# Patient Record
Sex: Male | Born: 1975 | Race: White | Hispanic: No | Marital: Single | Smoking: Current every day smoker
Health system: Southern US, Community
[De-identification: ages and names within clinical notes are randomized; demographics above are authoritative.]

## PROBLEM LIST (undated history)

## (undated) DIAGNOSIS — R569 Unspecified convulsions: Secondary | ICD-10-CM

## (undated) DIAGNOSIS — E119 Type 2 diabetes mellitus without complications: Secondary | ICD-10-CM

## (undated) DIAGNOSIS — F319 Bipolar disorder, unspecified: Secondary | ICD-10-CM

## (undated) DIAGNOSIS — I517 Cardiomegaly: Secondary | ICD-10-CM

## (undated) HISTORY — PX: HERNIA REPAIR: SHX51

---

## 2005-08-23 ENCOUNTER — Emergency Department: Payer: Self-pay | Admitting: Emergency Medicine

## 2006-02-08 ENCOUNTER — Emergency Department: Payer: Self-pay | Admitting: Emergency Medicine

## 2006-02-16 ENCOUNTER — Emergency Department: Payer: Self-pay | Admitting: Emergency Medicine

## 2006-07-20 ENCOUNTER — Emergency Department: Payer: Self-pay | Admitting: Emergency Medicine

## 2007-03-09 ENCOUNTER — Emergency Department: Payer: Self-pay | Admitting: General Practice

## 2007-05-25 ENCOUNTER — Emergency Department: Payer: Self-pay | Admitting: Emergency Medicine

## 2007-05-29 ENCOUNTER — Emergency Department: Payer: Self-pay | Admitting: Unknown Physician Specialty

## 2008-02-08 ENCOUNTER — Emergency Department: Payer: Self-pay | Admitting: Internal Medicine

## 2010-03-30 ENCOUNTER — Emergency Department: Payer: Self-pay | Admitting: Internal Medicine

## 2010-09-16 ENCOUNTER — Emergency Department: Payer: Self-pay | Admitting: Emergency Medicine

## 2011-01-01 ENCOUNTER — Emergency Department: Payer: Self-pay | Admitting: Emergency Medicine

## 2011-01-06 ENCOUNTER — Inpatient Hospital Stay: Payer: Self-pay | Admitting: Internal Medicine

## 2011-01-18 ENCOUNTER — Inpatient Hospital Stay: Payer: Self-pay | Admitting: Internal Medicine

## 2011-02-04 ENCOUNTER — Ambulatory Visit: Payer: Self-pay | Admitting: Internal Medicine

## 2011-04-10 ENCOUNTER — Emergency Department: Payer: Self-pay | Admitting: Emergency Medicine

## 2011-07-29 ENCOUNTER — Emergency Department: Payer: Self-pay | Admitting: Emergency Medicine

## 2013-04-12 LAB — ETHANOL
Ethanol %: <0.003 % (ref 0.000–0.080)
Ethanol: <3 mg/dL

## 2013-04-12 LAB — DRUG SCREEN, URINE
Amphetamines, Ur Screen: POSITIVE (ref ?–1000)
Benzodiazepine, Ur Scrn: NEGATIVE (ref ?–200)
MDMA (Ecstasy)Ur Screen: NEGATIVE (ref ?–500)
Methadone, Ur Screen: NEGATIVE (ref ?–300)
Opiate, Ur Screen: NEGATIVE (ref ?–300)
Phencyclidine (PCP) Ur S: NEGATIVE (ref ?–25)
Tricyclic, Ur Screen: NEGATIVE (ref ?–1000)

## 2013-04-12 LAB — COMPREHENSIVE METABOLIC PANEL
Albumin: 3.6 g/dL (ref 3.4–5.0)
Alkaline Phosphatase: 55 U/L (ref 50–136)
Anion Gap: 7 (ref 7–16)
BUN: 22 mg/dL — ABNORMAL HIGH (ref 7–18)
Calcium, Total: 9.6 mg/dL (ref 8.5–10.1)
Creatinine: 0.98 mg/dL (ref 0.60–1.30)
EGFR (African American): >60
Glucose: 149 mg/dL — ABNORMAL HIGH (ref 65–99)
Osmolality: 287 (ref 275–301)
Total Protein: 6.9 g/dL (ref 6.4–8.2)

## 2013-04-12 LAB — CBC
HCT: 45 % (ref 40.0–52.0)
HGB: 15.4 g/dL (ref 13.0–18.0)
MCH: 30.6 pg (ref 26.0–34.0)
MCHC: 34.3 g/dL (ref 32.0–36.0)
Platelet: 196 10*3/uL (ref 150–440)
RBC: 5.05 10*6/uL (ref 4.40–5.90)
WBC: 12 10*3/uL — ABNORMAL HIGH (ref 3.8–10.6)

## 2013-04-12 LAB — SALICYLATE LEVEL: Salicylates, Serum: 2 mg/dL

## 2013-04-13 ENCOUNTER — Inpatient Hospital Stay: Payer: Self-pay | Admitting: Psychiatry

## 2013-04-13 LAB — URINALYSIS, COMPLETE
Bacteria: NONE SEEN
Bilirubin,UR: NEGATIVE
Blood: NEGATIVE
Glucose,UR: >=500 mg/dL (ref 0–75)
Ketone: NEGATIVE
Protein: NEGATIVE
Specific Gravity: 1.016 (ref 1.003–1.030)
Squamous Epithelial: NONE SEEN

## 2013-04-14 LAB — BEHAVIORAL MEDICINE 1 PANEL
Albumin: 3.2 g/dL — ABNORMAL LOW (ref 3.4–5.0)
Alkaline Phosphatase: 56 U/L (ref 50–136)
Anion Gap: 7 (ref 7–16)
Basophil #: 0 10*3/uL (ref 0.0–0.1)
Calcium, Total: 9 mg/dL (ref 8.5–10.1)
Co2: 26 mmol/L (ref 21–32)
EGFR (Non-African Amer.): >60
Eosinophil %: 0.7 %
Glucose: 192 mg/dL — ABNORMAL HIGH (ref 65–99)
HCT: 41.2 % (ref 40.0–52.0)
MCH: 30.6 pg (ref 26.0–34.0)
MCV: 88 fL (ref 80–100)
Monocyte #: 0.8 x10 3/mm (ref 0.2–1.0)
Monocyte %: 8.2 %
Potassium: 3.5 mmol/L (ref 3.5–5.1)
RBC: 4.68 10*6/uL (ref 4.40–5.90)
SGOT(AST): 10 U/L — ABNORMAL LOW (ref 15–37)
Sodium: 141 mmol/L (ref 136–145)
Thyroid Stimulating Horm: 2.49 u[IU]/mL
Total Protein: 6.5 g/dL (ref 6.4–8.2)
WBC: 9.7 10*3/uL (ref 3.8–10.6)

## 2014-03-16 ENCOUNTER — Emergency Department: Payer: Self-pay | Admitting: Emergency Medicine

## 2014-04-16 ENCOUNTER — Emergency Department: Payer: Self-pay | Admitting: Internal Medicine
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
## 2015-01-12 NOTE — Discharge Summary (Signed)
PATIENT NAME:  Jacob Holmes, Jacob Holmes MR#:  [id number] DATE OF BIRTH:  10-Jun-1976  DATE OF ADMISSION:  04/13/2013 DATE OF DISCHARGE:    CHIEF COMPLAINT: The patient reported that he was feeling very sad and was thinking about ending his life.  HISTORY OF PRESENT ILLNESS: The patient is a 39 year old, Caucasian male, who came to the Emergency Room after he had a conflict or a situation with his ex-girlfriend. The patient reported a complicated relationship with his ex-girlfriend's daughter. This fall out had caused him to become very depressed and feel helpless and hopeless. He was thinking of ending his life and at that point, came to the ER. He sees Dr. Lacie ScottsNiemeyer for medication management and takes Depakote for seizure disorder. The patient denied any illegal use of drugs or alcohol.   HOSPITAL COURSE: The patient was admitted on usual precautions. He was restarted on his Depakote 1000 mg at night for epilepsy and Zoloft at 25 mg to help with mood symptoms. He was also started on hydroxyzine 25 mg to be taken as needed, once every 4 hours for his anxiety. The patient was cooperative on the unit and participated in all unit activities. He was able to make use of groups and develop coping skills to deal with a personal situation. Throughout the course of his hospital stay, the patient gained better perspective into his situation and realized that he needed to let things go and focus on his own life. By the end of this hospitalization, the patient was very much stabilized and did not report any suicidal or homicidal thoughts. He was mentally stable and ready for discharge.   PAST MEDICAL HISTORY: History of seizures and back pain.  FAMILY HISTORY: Mental illness: Reports mother has depression and a sister with bipolar disorder.  SOCIAL HISTORY:  The patient lives by himself.   SUBSTANCE USE HISTORY: The patient denied any use, but his initial drug screen was positive for amphetamines.   ALLERGIES:  MORPHINE.  MENTAL STATUS EXAM AT DISCHARGE: The patient was casually groomed. He had good eye contact and speech was soft and slow, which is normal to him. He was alert and oriented to all spheres. His affect was smiling and mood was much improved. He denied any suicidal or homicidal ideation. He denied any auditory or visual hallucinations. He has fair insight and judgment into his condition.   Suicide Risk Assessment: Patient is at minimal risk for suicide.  DIAGNOSES: AXIS I:  Major depressive disorder. AXIS II: Deferred. AXIS III: Epilepsy and chronic back pain. AXIS IV: Relationship issues and financial stressors. AXIS V: GAF of 75.  DISPOSITION/PLAN: Continue Depakote 1000 mg at bedtime for his epilepsy. Continue Zoloft 25 mg for depression. The patient is to follow up with an outside psychiatrist for ongoing treatment. recommended to see a therapist on a weekly basis to deal with ex girlfriend situation. The patient is aware that he is to make sure that he keeps his followup appointments.  ____________________________ Patrick NorthHimabindu Shawnette Augello, MD hr:aw D: 04/18/2013 11:10:30 ET T: 04/18/2013 11:23:22 ET JOB#: [id number]  cc: Patrick NorthHimabindu Lavanya Roa, MD, <Dictator> Patrick NorthHIMABINDU Joachim Carton MD ELECTRONICALLY SIGNED 04/20/2013 12:12

## 2015-05-07 ENCOUNTER — Emergency Department
Admission: EM | Admit: 2015-05-07 | Discharge: 2015-05-08 | Disposition: A | Discharge status: Other Acute Inpatient Hospital | Payer: Medicaid Other | Attending: Emergency Medicine | Admitting: Emergency Medicine

## 2015-05-07 ENCOUNTER — Encounter: Payer: Self-pay | Admitting: Urgent Care

## 2015-05-07 DIAGNOSIS — F329 Major depressive disorder, single episode, unspecified: Secondary | ICD-10-CM

## 2015-05-07 DIAGNOSIS — F1510 Other stimulant abuse, uncomplicated: Secondary | ICD-10-CM | POA: Insufficient documentation

## 2015-05-07 DIAGNOSIS — F1490 Cocaine use, unspecified, uncomplicated: Secondary | ICD-10-CM | POA: Diagnosis not present

## 2015-05-07 DIAGNOSIS — F1310 Sedative, hypnotic or anxiolytic abuse, uncomplicated: Secondary | ICD-10-CM | POA: Diagnosis not present

## 2015-05-07 DIAGNOSIS — F3130 Bipolar disorder, current episode depressed, mild or moderate severity, unspecified: Secondary | ICD-10-CM | POA: Diagnosis not present

## 2015-05-07 DIAGNOSIS — F1210 Cannabis abuse, uncomplicated: Secondary | ICD-10-CM | POA: Diagnosis not present

## 2015-05-07 DIAGNOSIS — Z720 Tobacco use: Secondary | ICD-10-CM | POA: Diagnosis not present

## 2015-05-07 DIAGNOSIS — Z008 Encounter for other general examination: Secondary | ICD-10-CM | POA: Diagnosis present

## 2015-05-07 DIAGNOSIS — F32A Depression, unspecified: Secondary | ICD-10-CM

## 2015-05-07 HISTORY — DX: Cardiomegaly: I51.7

## 2015-05-07 HISTORY — DX: Unspecified convulsions: R56.9

## 2015-05-07 HISTORY — DX: Bipolar disorder, unspecified: F31.9

## 2015-05-07 LAB — CBC
HCT: 47.5 % (ref 40.0–52.0)
HEMOGLOBIN: 15.8 g/dL (ref 13.0–18.0)
MCH: 30.1 pg (ref 26.0–34.0)
MCHC: 33.2 g/dL (ref 32.0–36.0)
MCV: 90.5 fL (ref 80.0–100.0)
Platelets: 184 10*3/uL (ref 150–440)
RBC: 5.25 MIL/uL (ref 4.40–5.90)
RDW: 12.8 % (ref 11.5–14.5)
WBC: 10.2 10*3/uL (ref 3.8–10.6)

## 2015-05-07 LAB — COMPREHENSIVE METABOLIC PANEL
ALBUMIN: 4.3 g/dL (ref 3.5–5.0)
ALT: 22 U/L (ref 17–63)
AST: 24 U/L (ref 15–41)
Alkaline Phosphatase: 45 U/L (ref 38–126)
Anion gap: 11 (ref 5–15)
BUN: 14 mg/dL (ref 6–20)
CHLORIDE: 104 mmol/L (ref 101–111)
CO2: 27 mmol/L (ref 22–32)
CREATININE: 0.95 mg/dL (ref 0.61–1.24)
Calcium: 9.3 mg/dL (ref 8.9–10.3)
GFR calc non Af Amer: >60 mL/min (ref >60)
GLUCOSE: 213 mg/dL — AB (ref 65–99)
Potassium: 3.6 mmol/L (ref 3.5–5.1)
SODIUM: 142 mmol/L (ref 135–145)
Total Bilirubin: 0.6 mg/dL (ref 0.3–1.2)
Total Protein: 7.2 g/dL (ref 6.5–8.1)

## 2015-05-07 LAB — ACETAMINOPHEN LEVEL: Acetaminophen (Tylenol), Serum: <10 ug/mL — ABNORMAL LOW (ref 10–30)

## 2015-05-07 LAB — SALICYLATE LEVEL

## 2015-05-07 LAB — URINE DRUG SCREEN, QUALITATIVE (ARMC ONLY)
Amphetamines, Ur Screen: POSITIVE — AB
Barbiturates, Ur Screen: NOT DETECTED
Benzodiazepine, Ur Scrn: POSITIVE — AB
CANNABINOID 50 NG, UR ~~LOC~~: POSITIVE — AB
COCAINE METABOLITE, UR ~~LOC~~: POSITIVE — AB
MDMA (ECSTASY) UR SCREEN: NOT DETECTED
Methadone Scn, Ur: NOT DETECTED
Opiate, Ur Screen: POSITIVE — AB
Phencyclidine (PCP) Ur S: NOT DETECTED
TRICYCLIC, UR SCREEN: NOT DETECTED

## 2015-05-07 LAB — ETHANOL: Alcohol, Ethyl (B): 7 mg/dL — ABNORMAL HIGH (ref <5)

## 2015-05-07 MED ORDER — NICOTINE 10 MG IN INHA
1.0000 | RESPIRATORY_TRACT | Status: DC | PRN
  Administered 2015-05-08: 1 via RESPIRATORY_TRACT
  Filled 2015-05-07: qty 36

## 2015-05-07 NOTE — ED Notes (Signed)
Patient was dressed out into purple paper scrubs and left with the triage nurse.

## 2015-05-07 NOTE — ED Notes (Signed)
Patient presents for [facility] evaluation. Expressing SI without a specific plan; states, "I want to go to sleep and call it over. I think the world would be better without me here". Patient expressing recent loss of his father. Tearfual and anxious in triage. Patient off of Lamictal and Depakote.

## 2015-05-07 NOTE — ED Provider Notes (Signed)
[facility] Emergency Department Provider Note  ____________________________________________  Time seen: Approximately 11:09 PM  I have reviewed the triage vital signs and the nursing notes.   HISTORY  Chief Complaint Psychiatric Evaluation    HPI Jacob Holmes is a 39 y.o. male who presents to the ED from home for behavioral health evaluation. Expressing depression with vague SI. "I want to go to sleep and call it over. I think the world would be better without me here." Upset at over recent loss of father. Supposed to be on Lamictal and Depakote but states he has not been taking his medicines.Voices no medical complaints.   Past Medical History  Diagnosis Date  . Bipolar disorder   . Seizures   . Cardiomegaly     There are no active problems to display for this patient.   Past Surgical History  Procedure Laterality Date  . Hernia repair      No current outpatient prescriptions on file.  Allergies Review of patient's allergies indicates no known allergies.  No family history on file.  Social History Social History  Substance Use Topics  . Smoking status: Current Every Day Smoker  . Smokeless tobacco: None  . Alcohol Use: No    Review of Systems Constitutional: No fever/chills Eyes: No visual changes. ENT: No sore throat. Cardiovascular: Denies chest pain. Respiratory: Denies shortness of breath. Gastrointestinal: No abdominal pain.  No nausea, no vomiting.  No diarrhea.  No constipation. Genitourinary: Negative for dysuria. Musculoskeletal: Negative for back pain. Skin: Negative for rash. Neurological: Negative for headaches, focal weakness or numbness. Psychiatric:Positive for depression.  10-point ROS otherwise negative.  ____________________________________________   PHYSICAL EXAM:  VITAL SIGNS: ED Triage Vitals  Enc Vitals Group     BP 05/07/15 2138 141/108 mmHg     Pulse Rate 05/07/15 2138 105     Resp  05/07/15 2138 22     Temp 05/07/15 2138 98.6 F (37 C)     Temp Source 05/07/15 2138 Oral     SpO2 05/07/15 2138 99 %     Weight 05/07/15 2138 170 lb (77.111 kg)     Height 05/07/15 2138 5\' 6"  (1.676 m)     Head Cir --      Peak Flow --      Pain Score 05/07/15 2139 0     Pain Loc --      Pain Edu? --      Excl. in GC? --     Constitutional: Alert and oriented. Well appearing and in no acute distress. Eyes: Conjunctivae are normal. PERRL. EOMI. Head: Atraumatic. Nose: No congestion/rhinnorhea. Mouth/Throat: Mucous membranes are moist.  Oropharynx non-erythematous. Neck: No stridor.   Cardiovascular: Normal rate, regular rhythm. Grossly normal heart sounds.  Good peripheral circulation. Respiratory: Normal respiratory effort.  No retractions. Lungs CTAB. Gastrointestinal: Soft and nontender. No distention. No abdominal bruits. No CVA tenderness. Musculoskeletal: No lower extremity tenderness nor edema.  No joint effusions. Neurologic:  Normal speech and language. No gross focal neurologic deficits are appreciated. No gait instability. Skin:  Skin is warm, dry and intact. No rash noted. Psychiatric: Mood and affect are flat. Speech and behavior are normal.  ____________________________________________   LABS (all labs ordered are listed, but only abnormal results are displayed)  Labs Reviewed  COMPREHENSIVE METABOLIC PANEL - Abnormal; Notable for the following:    Glucose, Bld 213 (*)    All other components within normal limits  ETHANOL - Abnormal; Notable for the following:  Alcohol, Ethyl (B) 7 (*)    All other components within normal limits  ACETAMINOPHEN LEVEL - Abnormal; Notable for the following:    Acetaminophen (Tylenol), Serum <10 (*)    All other components within normal limits  URINE DRUG SCREEN, QUALITATIVE ([facility] ONLY) - Abnormal; Notable for the following:    Amphetamines, Ur Screen POSITIVE (*)    Cocaine Metabolite,Ur [place] POSITIVE (*)    Opiate, Ur  Screen POSITIVE (*)    Cannabinoid 50 Ng, Ur [place] POSITIVE (*)    Benzodiazepine, Ur Scrn POSITIVE (*)    All other components within normal limits  SALICYLATE LEVEL  CBC   ____________________________________________  EKG  None  ____________________________________________  RADIOLOGY  None ____________________________________________   PROCEDURES  Procedure(s) performed: None  Critical Care performed: No  ____________________________________________   INITIAL IMPRESSION / ASSESSMENT AND PLAN / ED COURSE  Pertinent labs & imaging results that were available during my care of the patient were reviewed by me and considered in my medical decision making (see chart for details).  39 year old male with a history of bipolar disorder who presents with depression with vague SI. Patient is calm and cooperative; remains voluntary at this time pending TTS evaluation. Patient is medically cleared to transfer to [facility].  ----------------------------------------- 5:47 AM on 05/08/2015 -----------------------------------------  No events overnight. Patient is resting in no acute distress. Pending psychiatry consult this morning. ____________________________________________   FINAL CLINICAL IMPRESSION(S) / ED DIAGNOSES  Final diagnoses:  Depression  Cocaine use      Irean Hong, MD 05/08/15 [id number]

## 2015-05-08 ENCOUNTER — Inpatient Hospital Stay (HOSPITAL_COMMUNITY)
Admission: AD | Admit: 2015-05-08 | Discharge: 2015-05-11 | DRG: 885 | Disposition: A | Discharge status: Home / Self Care | Payer: Medicaid Other | Source: Intra-hospital | Attending: Psychiatry | Admitting: Psychiatry

## 2015-05-08 ENCOUNTER — Encounter (HOSPITAL_COMMUNITY): Payer: Self-pay

## 2015-05-08 ENCOUNTER — Encounter: Payer: Self-pay | Admitting: Occupational Medicine

## 2015-05-08 DIAGNOSIS — F3130 Bipolar disorder, current episode depressed, mild or moderate severity, unspecified: Secondary | ICD-10-CM | POA: Diagnosis not present

## 2015-05-08 DIAGNOSIS — F1910 Other psychoactive substance abuse, uncomplicated: Secondary | ICD-10-CM | POA: Diagnosis not present

## 2015-05-08 DIAGNOSIS — R45851 Suicidal ideations: Secondary | ICD-10-CM | POA: Diagnosis present

## 2015-05-08 DIAGNOSIS — F419 Anxiety disorder, unspecified: Secondary | ICD-10-CM | POA: Diagnosis present

## 2015-05-08 DIAGNOSIS — F1120 Opioid dependence, uncomplicated: Secondary | ICD-10-CM | POA: Diagnosis present

## 2015-05-08 DIAGNOSIS — F332 Major depressive disorder, recurrent severe without psychotic features: Secondary | ICD-10-CM | POA: Diagnosis present

## 2015-05-08 DIAGNOSIS — Z634 Disappearance and death of family member: Secondary | ICD-10-CM | POA: Diagnosis not present

## 2015-05-08 DIAGNOSIS — G4700 Insomnia, unspecified: Secondary | ICD-10-CM | POA: Diagnosis present

## 2015-05-08 DIAGNOSIS — F17210 Nicotine dependence, cigarettes, uncomplicated: Secondary | ICD-10-CM | POA: Diagnosis present

## 2015-05-08 DIAGNOSIS — F329 Major depressive disorder, single episode, unspecified: Secondary | ICD-10-CM | POA: Diagnosis present

## 2015-05-08 MED ORDER — NAPROXEN 500 MG PO TABS: 500.0000 mg | ORAL_TABLET | Freq: Two times a day (BID) | ORAL | Status: DC | PRN

## 2015-05-08 MED ORDER — CLONIDINE HCL 0.1 MG PO TABS
0.1000 mg | ORAL_TABLET | Freq: Four times a day (QID) | ORAL | Status: AC
  Administered 2015-05-08 – 2015-05-11 (×10): 0.1 mg via ORAL
  Filled 2015-05-08 (×11): qty 1

## 2015-05-08 MED ORDER — NICOTINE POLACRILEX 2 MG MT GUM: 2.0000 mg | CHEWING_GUM | OROMUCOSAL | Status: DC | PRN

## 2015-05-08 MED ORDER — LOPERAMIDE HCL 2 MG PO CAPS: 2.0000 mg | ORAL_CAPSULE | ORAL | Status: DC | PRN

## 2015-05-08 MED ORDER — HYDROXYZINE HCL 25 MG PO TABS
25.0000 mg | ORAL_TABLET | Freq: Four times a day (QID) | ORAL | Status: DC | PRN
  Administered 2015-05-09: 25 mg via ORAL
  Filled 2015-05-08: qty 30
  Filled 2015-05-08: qty 1

## 2015-05-08 MED ORDER — MAGNESIUM HYDROXIDE 400 MG/5ML PO SUSP: 30.0000 mL | Freq: Every day | ORAL | Status: DC | PRN

## 2015-05-08 MED ORDER — ALUM & MAG HYDROXIDE-SIMETH 200-200-20 MG/5ML PO SUSP: 30.0000 mL | ORAL | Status: DC | PRN

## 2015-05-08 MED ORDER — METHOCARBAMOL 500 MG PO TABS
500.0000 mg | ORAL_TABLET | Freq: Three times a day (TID) | ORAL | Status: DC | PRN
  Administered 2015-05-08 – 2015-05-09 (×2): 500 mg via ORAL
  Filled 2015-05-08 (×2): qty 1

## 2015-05-08 MED ORDER — CLONIDINE HCL 0.1 MG PO TABS: 0.1000 mg | ORAL_TABLET | Freq: Every day | ORAL | Status: DC

## 2015-05-08 MED ORDER — ACETAMINOPHEN 325 MG PO TABS: 650.0000 mg | ORAL_TABLET | Freq: Four times a day (QID) | ORAL | Status: DC | PRN

## 2015-05-08 MED ORDER — ONDANSETRON 4 MG PO TBDP: 4.0000 mg | ORAL_TABLET | Freq: Four times a day (QID) | ORAL | Status: DC | PRN

## 2015-05-08 MED ORDER — DICYCLOMINE HCL 20 MG PO TABS: 20.0000 mg | ORAL_TABLET | Freq: Four times a day (QID) | ORAL | Status: DC | PRN

## 2015-05-08 MED ORDER — CLONIDINE HCL 0.1 MG PO TABS
0.1000 mg | ORAL_TABLET | ORAL | Status: DC
  Filled 2015-05-08 (×2): qty 1

## 2015-05-08 MED ORDER — DIVALPROEX SODIUM 500 MG PO DR TAB
1000.0000 mg | DELAYED_RELEASE_TABLET | Freq: Once | ORAL | Status: AC
  Administered 2015-05-08: 1000 mg via ORAL
  Filled 2015-05-08 (×2): qty 2

## 2015-05-08 MED ORDER — TRAZODONE HCL 50 MG PO TABS
50.0000 mg | ORAL_TABLET | Freq: Every evening | ORAL | Status: DC | PRN
  Administered 2015-05-08 – 2015-05-09 (×2): 50 mg via ORAL
  Filled 2015-05-08 (×2): qty 1

## 2015-05-08 NOTE — ED Notes (Signed)
ED BHU PLACEMENT JUSTIFICATION Is the patient under IVC or is there intent for IVC: Yes.   Is the patient medically cleared: Yes.   Is there vacancy in the ED BHU: Yes.   Is the population mix appropriate for patient: Yes.   Is the patient awaiting placement in inpatient or outpatient setting: Yes.   Has the patient had a psychiatric consult: Yes.  seen by TTS Survey of unit performed for contraband, proper placement and condition of furniture, tampering with fixtures in bathroom, shower, and each patient room: Yes.  ; Findings:  APPEARANCE/BEHAVIOR calm and cooperative NEURO ASSESSMENT Orientation: time, place and person Hallucinations: No.None noted (Hallucinations) Speech: Normal Gait: normal RESPIRATORY ASSESSMENT Normal expansion.  Clear to auscultation.  No rales, rhonchi, or wheezing. CARDIOVASCULAR ASSESSMENT regular rate and rhythm, S1, S2 normal, no murmur, click, rub or gallop GASTROINTESTINAL ASSESSMENT soft, nontender, BS WNL, no r/g EXTREMITIES normal strength, tone, and muscle mass PLAN OF CARE Provide calm/safe environment. Vital signs assessed twice daily. ED BHU Assessment once each 12-hour shift. Collaborate with intake RN daily or as condition indicates. Assure the ED provider has rounded once each shift. Provide and encourage hygiene. Provide redirection as needed. Assess for escalating behavior; address immediately and inform ED provider.  Educate the patient/family about BHU procedures/visitation: Yes.  ; If necessary, describe findings:

## 2015-05-08 NOTE — BH Assessment (Signed)
Assessment Note  Jacob Holmes is an 39 y.o. male. Mr. Jacob Holmes reports that he arrived to the ED seeking assistance.  He states that  "I been real depressed for quite a few months. My father passed away about a month and a half ago".  He states that he does not see the point of life anymore. "I just quit everything and have given up on life. I can't keep going on like this".  [name] denied anxiety symptoms.  He denied having auditory or visual hallucinations.  He denied homicidal ideation or intent.  He expressed suicidal ideation, intent, and his plan to overdose.   Axis I: Bipolar, mixed Axis II: Deferred Axis III:  Past Medical History  Diagnosis Date  . Bipolar disorder   . Seizures   . Cardiomegaly    Axis IV: other psychosocial or environmental problems Axis V: 11-20 some danger of hurting self or others possible OR occasionally fails to maintain minimal personal hygiene OR gross impairment in communication  Past Medical History:  Past Medical History  Diagnosis Date  . Bipolar disorder   . Seizures   . Cardiomegaly     Past Surgical History  Procedure Laterality Date  . Hernia repair      Family History: History reviewed. No pertinent family history.  Social History:  reports that he has been smoking.  He does not have any smokeless tobacco history on file. He reports that he does not drink alcohol. His drug history is not on file.  Additional Social History:  Alcohol / Drug Use History of alcohol / drug use?: Yes Substance #1 Name of Substance 1: Marijuana 1 - Age of First Use: 17 1 - Amount (size/oz): "A couple drags" to 1 blunt 1 - Frequency: Randomly - "When  I have any 1 - Last Use / Amount: 05/07/2015  CIWA: CIWA-Ar BP: (!) 141/108 mmHg Pulse Rate: (!) 105 COWS:    Allergies: No Known Allergies  Home Medications:  (Not in a hospital admission)  OB/GYN Status:  No LMP for male patient.  General Assessment Data Location of Assessment: [facility]  ED TTS Assessment: In system Is this a Tele or Face-to-Face Assessment?: Face-to-Face Is this an Initial Assessment or a Re-assessment for this encounter?: Initial Assessment Marital status: Single Maiden name: na Is patient pregnant?: No Pregnancy Status: No Living Arrangements: Parent Can pt return to current living arrangement?: Yes Admission Status: Involuntary Is patient capable of signing voluntary admission?: No Referral Source: MD Insurance type: Medicare  Medical Screening Exam [facility] Walk-in ONLY) Medical Exam completed: Yes  Crisis Care Plan Living Arrangements: Parent Name of Psychiatrist: None reported Name of Therapist: None reported  Education Status Is patient currently in school?: No Current Grade: none Highest grade of school patient has completed: 9th Name of school: [employer] person: na  Risk to self with the past 6 months Suicidal Ideation: Yes-Currently Present Has patient been a risk to self within the past 6 months prior to admission? : Yes Suicidal Intent: Yes-Currently Present Has patient had any suicidal intent within the past 6 months prior to admission? : Yes Is patient at risk for suicide?: Yes Suicidal Plan?: Yes-Currently Present Has patient had any suicidal plan within the past 6 months prior to admission? : Yes Specify Current Suicidal Plan: Overdose on pills Access to Means: Yes Specify Access to Suicidal Means: Access to pills What has been your use of drugs/alcohol within the last 12 months?: Marijuana Previous Attempts/Gestures: Yes How many  times?: 1 Other Self Harm Risks: none Triggers for Past Attempts: Unknown Intentional Self Injurious Behavior: None Family Suicide History: No Recent stressful life event(s): Loss (Comment), Trauma (Comment) (father passed away) Persecutory voices/beliefs?: No Depression Symptoms: Despondent, Insomnia, Tearfulness, Feeling angry/irritable Substance abuse history and/or treatment  for substance abuse?: No Suicide prevention information given to non-admitted patients: Not applicable  Risk to Others within the past 6 months Homicidal Ideation: No Does patient have any lifetime risk of violence toward others beyond the six months prior to admission? : No Thoughts of Harm to Others: No Current Homicidal Intent: No-Not Currently/Within Last 6 Months Current Homicidal Plan: No Access to Homicidal Means: No Identified Victim: None reported History of harm to others?: No Assessment of Violence: On admission Violent Behavior Description: None reported Does patient have access to weapons?: No Criminal Charges Pending?: No Does patient have a court date: No Is patient on probation?: No  Psychosis Hallucinations: None noted (Reports that others believe he is having hallucinations) Delusions: None noted  Mental Status Report Appearance/Hygiene: In scrubs Eye Contact: Poor Motor Activity: Unremarkable Speech: Soft, Slow Level of Consciousness: Alert Mood: Depressed Affect: Sad Anxiety Level: None Thought Processes: Coherent Judgement: Unimpaired Orientation: Person, Place, Situation, Time Obsessive Compulsive Thoughts/Behaviors: None     ADLScreening ([facility] Assessment Services) Patient's cognitive ability adequate to safely complete daily activities?: Yes Patient able to express need for assistance with ADLs?: Yes Independently performs ADLs?: Yes (appropriate for developmental age)  Prior Inpatient Therapy Prior Inpatient Therapy: No  Prior Outpatient Therapy Prior Outpatient Therapy: No Does patient have an ACCT team?: No Does patient have Intensive In-House Services?  : No Does patient have [facility] services? : No Does patient have P[facility] services?: Unknown  ADL Screening (condition at time of admission) Patient's cognitive ability adequate to safely complete daily activities?: Yes Patient able to express need for assistance with ADLs?:  Yes Independently performs ADLs?: Yes (appropriate for developmental age)       Abuse/Neglect Assessment (Assessment to be complete while patient is alone) Physical Abuse: Denies Verbal Abuse: Denies Sexual Abuse: Denies Exploitation of patient/patient's resources: Denies Values / Beliefs Cultural Requests During Hospitalization: None Spiritual Requests During Hospitalization: None        Additional Information 1:1 In Past 12 Months?: No CIRT Risk: No Elopement Risk: No Does patient have medical clearance?: Yes     Disposition:  Disposition Initial Assessment Completed for this Encounter: Yes Disposition of Patient: Referred to (To be seen by the psychiatrist)  On Site Evaluation by:   Reviewed with Physician:    Justice Deeds 05/08/2015 1:05 AM
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
## 2015-05-08 NOTE — ED Notes (Signed)

## 2015-05-08 NOTE — ED Notes (Signed)
BEHAVIORAL HEALTH ROUNDING Patient sleeping: No. Patient alert and oriented: yes Behavior appropriate: Yes.  ; If no, describe:  Nutrition and fluids offered: Yes  Toileting and hygiene offered: yes Sitter present: no Law enforcement present: Yes  

## 2015-05-08 NOTE — ED Notes (Signed)
Called [facility] at [phone] to call report. No answer, call rolled over, no answer

## 2015-05-08 NOTE — ED Notes (Signed)
Pt. Noted sleeping in room. No complaints or concerns voiced. No distress or abnormal behavior noted. Will continue to monitor with security cameras. Q 15 minute rounds continue. 

## 2015-05-08 NOTE — BHH Counselor (Signed)
Per Dr. Lucianne Muss in the [facility], look for inpatient. Printed [facility] Assessment and Facesheet.

## 2015-05-08 NOTE — ED Notes (Signed)
BEHAVIORAL HEALTH ROUNDING Patient sleeping: No. Patient alert and oriented: yes Behavior appropriate: Yes.  ; If no, describe:  Nutrition and fluids offered: Yes  Toileting and hygiene offered: Yes  Sitter present: no Law enforcement present: Yes  

## 2015-05-08 NOTE — ED Notes (Signed)
Patient assigned to appropriate care area. Patient oriented to unit/care area: Informed that, for their safety, care areas are designed for safety and monitored by security cameras at all times; and visiting hours explained to patient. Patient verbalizes understanding, and verbal contract for safety obtained. 

## 2015-05-08 NOTE — ED Provider Notes (Signed)
Patient is accepted in transfer to [facility] by Dr. Dub Mikes. I discussed this with Dr. Lucianne Muss of our psychiatric service, and they have arranged transfer. I have reexamined the patient and discussed with the patient risk/benefit who is agreeable. Awake alert. No acute distress vital signs stable at this time.  Sharyn Creamer, MD 05/09/15 [phone]

## 2015-05-08 NOTE — Progress Notes (Signed)
Patient in bed, he appeared sick. Reported that he had chills and flu like symptoms.  Patient stated that he has been getting opiated off the street for the past 6 months. His B/P was high. Writer notified PA and requested for clonidine protocol for patient. PA gave Clinical research associate verbal order for Clonidine protocol. Q 15 minute check continues as ordered for safety.

## 2015-05-08 NOTE — ED Notes (Signed)
BEHAVIORAL HEALTH ROUNDING Patient sleeping: Yes.   Patient alert and oriented: not applicable Behavior appropriate: Yes.  ; If no, describe:  Nutrition and fluids offered: No Toileting and hygiene offered: Yes  Sitter present: no Law enforcement present: Yes  

## 2015-05-08 NOTE — ED Notes (Signed)
BEHAVIORAL HEALTH ROUNDING Patient sleeping: No. Patient alert and oriented: yes Behavior appropriate: Yes.  ; If no, describe:  Nutrition and fluids offered: Yes  Toileting and hygiene offered: Yes  Sitter present: yes Law enforcement present: Yes  

## 2015-05-08 NOTE — BHH Counselor (Signed)
Pt. is to be admitted to [facility] (Per Intake Minerva Areola) by  Dr. Lucianne Muss. Attending Physician will be Dr. Dub Mikes.  Pt. has been assigned to room 304 Bed 2.   Misty Stanley, ER Sect.; and  Dr. Fanny Bien, ER MD were notified.

## 2015-05-08 NOTE — Progress Notes (Signed)
Psychoeducational Group Note  Date:  05/08/2015 Time:  2100  Group Topic/Focus:  wrap up group  Participation Level: Did Not Attend  Participation Quality:  Not Applicable  Affect:  Not Applicable  Cognitive:  Not Applicable  Insight:  Not Applicable  Engagement in Group: Not Applicable  Additional Comments:   Pt was notified that group was beginning but remained in his room. Pt was just newly admitted to unit and shared he will attend groups tomorrow.   [name] 05/08/2015, 10:00 PM

## 2015-05-08 NOTE — ED Notes (Signed)
Report given to Gary RN 

## 2015-05-08 NOTE — Tx Team (Signed)
Initial Interdisciplinary Treatment Plan   PATIENT STRESSORS: Substance abuse Loss of relationship    PATIENT STRENGTHS: Ability for insight Average or above average intelligence Communication skills General fund of knowledge Motivation for treatment/growth Supportive family/friends   PROBLEM LIST: Problem List/Patient Goals Date to be addressed Date deferred Reason deferred Estimated date of resolution  Depression 05/08/2015     Self harm thoughts 05/08/2015     "I promised my father this" 05/08/2015     "to get my mind to accept things that's going on." 05/08/2015     Substance abuse 05/08/2015                              DISCHARGE CRITERIA:  Improved stabilization in mood, thinking, and/or behavior  PRELIMINARY DISCHARGE PLAN: Return to previous living arrangement  PATIENT/FAMIILY INVOLVEMENT: This treatment plan has been presented to and reviewed with the patient, Jacob Holmes, Jacob Holmes 05/08/2015, 7:34 PM

## 2015-05-08 NOTE — ED Notes (Signed)
Pt given lunch tray.

## 2015-05-08 NOTE — ED Notes (Signed)
BEHAVIORAL HEALTH ROUNDING Patient sleeping: Yes.   Patient alert and oriented: not applicable Behavior appropriate: Yes.  ; If no, describe:  Nutrition and fluids offered: Yes  Toileting and hygiene offered: Yes  Sitter present: no Law enforcement present: Yes  

## 2015-05-08 NOTE — ED Notes (Signed)
Pt. To [facility]PU from ED ambulatory without difficulty, to room 7 . Report from [facility] Pt. Is alert and oriented, warm and dry in no distress. Pt. Denies SI, HI, and AVH. Pt. Calm and cooperative. Pt. Made aware of security cameras and Q15 minute rounds. Pt. Encouraged to let Nursing staff know of any concerns or needs.

## 2015-05-09 ENCOUNTER — Encounter (HOSPITAL_COMMUNITY): Payer: Self-pay | Admitting: Psychiatry

## 2015-05-09 DIAGNOSIS — F332 Major depressive disorder, recurrent severe without psychotic features: Secondary | ICD-10-CM | POA: Diagnosis present

## 2015-05-09 DIAGNOSIS — F1120 Opioid dependence, uncomplicated: Secondary | ICD-10-CM | POA: Diagnosis present

## 2015-05-09 DIAGNOSIS — F1910 Other psychoactive substance abuse, uncomplicated: Secondary | ICD-10-CM | POA: Diagnosis present

## 2015-05-09 DIAGNOSIS — F3130 Bipolar disorder, current episode depressed, mild or moderate severity, unspecified: Secondary | ICD-10-CM | POA: Diagnosis present

## 2015-05-09 MED ORDER — DIVALPROEX SODIUM ER 500 MG PO TB24
1000.0000 mg | ORAL_TABLET | Freq: Every day | ORAL | Status: DC
  Administered 2015-05-09 – 2015-05-10 (×2): 1000 mg via ORAL
  Filled 2015-05-09 (×4): qty 2
  Filled 2015-05-09: qty 28
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
## 2015-05-09 NOTE — Tx Team (Signed)
Interdisciplinary Treatment Plan Update (Adult)  Date:  05/09/2015  Time Reviewed:  9:31 AM   Progress in Treatment: Attending groups: Yes. Participating in groups:  Yes. Taking medication as prescribed:  Yes. Tolerating medication:  Yes. Family/Significant othe contact made:  Not yet. SPE required for this pt.  Patient understands diagnosis:  Yes. and As evidenced by:  seeking treatment for opiate abuse, depression, SI, and for medication stabilization. Discussing patient identified problems/goals with staff:  Yes. Medical problems stabilized or resolved:  Yes. Denies suicidal/homicidal ideation: Yes. Issues/concerns per patient self-inventory:  Other:  New problem(s) identified:    Discharge Plan or Barriers: Pt wants to reconnect with [facility] and return home at d/c. Hospice grief counseling also offered through [facility] provided to pt.   Reason for Continuation of Hospitalization: Depression Medication stabilization Withdrawal symptoms  Comments:   Jacob Holmes is an 39 y.o. male. Mr. Jacob Holmes reports that he arrived to the ED seeking assistance. He states that "I been real depressed for quite a few months. My father passed away about a month and a half ago". He states that he does not see the point of life anymore. "I just quit everything and have given up on life. I can't keep going on like this". Mr. Keywon denied anxiety symptoms. He denied having auditory or visual hallucinations. He denied homicidal ideation or intent. He expressed suicidal ideation, intent, and his plan to overdose. Axis I: Bipolar, mixed  Estimated length of stay:  3-5 days  New goal(s): to formulate aftercare plan.   Additional Comments:  Patient and CSW reviewed pt's identified goals and treatment plan. Patient verbalized understanding and agreed to treatment plan. CSW reviewed [facility] "Discharge Process and Patient Involvement" Form. Pt verbalized understanding of information  provided and signed form.    Review of initial/current patient goals per problem list:  1. Goal(s): Patient will participate in aftercare plan  Met: No.   Target date: at discharge  As evidenced by: Patient will participate within aftercare plan AEB aftercare provider and housing plan at discharge being identified.  8/17: CSW assessing. Pt wants to reconnect with [facility] and return home.   2. Goal (s): Patient will exhibit decreased depressive symptoms and suicidal ideations.  Met: No.    Target date: at discharge  As evidenced by: Patient will utilize self rating of depression at 3 or below and demonstrate decreased signs of depression or be deemed stable for discharge by MD.  8/17: Pt rates depression as 4/10 and presents with depressed mood.   3. Goal(s): Patient will demonstrate decreased signs of withdrawal due to substance abuse  Met:No.   Target date:at discharge   As evidenced by: Patient will produce a CIWA/COWS score of 0, have stable vitals signs, and no symptoms of withdrawal.  8/17: Pt is on detox protocol. Reporting minimal withdrawal sx at this time with stable vitals. Goal progressing.    Attendees: Patient:   05/09/2015 9:31 AM   Family:   05/09/2015 9:31 AM   Physician:  Dr. Carlton Adam, MD 05/09/2015 9:31 AM   Nursing:   Chrys Racer RN; Caryl Pina RN 05/09/2015 9:31 AM   Clinical Social Worker: Maxie Better, [place]  05/09/2015 9:31 AM   Clinical Social Worker: Erasmo Downer Drinkard LCSWA; Peri Maris LCSWA 05/09/2015 9:31 AM   Other:  Gerline Legacy Nurse Case Manager 05/09/2015 9:31 AM   Other:  Lucinda Dell; [facility] TCT  05/09/2015 9:31 AM   Other:   05/09/2015 9:31 AM   Other:  05/09/2015 9:31 AM   Other:  05/09/2015 9:31 AM   Other:  05/09/2015 9:31 AM    05/09/2015 9:31 AM    05/09/2015 9:31 AM    05/09/2015 9:31 AM    05/09/2015 9:31 AM    Scribe for Treatment Team:   Maxie Better, [place]  05/09/2015 9:31 AM

## 2015-05-09 NOTE — Plan of Care (Signed)
Problem: Diagnosis: Increased Risk For Suicide Attempt Goal: STG-Patient Will Attend All Groups On The Unit Outcome: Not Progressing Patient did not attend group tonight. Goal: STG-Patient Will Report Suicidal Feelings to Staff Outcome: Progressing Patient denies SI, and verbally contracts for safety.

## 2015-05-09 NOTE — Progress Notes (Signed)
Recreation Therapy Notes  Date: 08.17.2016 Time: 9:30am Location: [address] Group room   Group Topic: Stress Management  Goal Area(s) Addresses:  Patient will actively participate in stress management techniques presented during session.   Behavioral Response: Did not attend.   Jared Whorley L Copelyn Widmer, LRT/CTRS        Yaresly Menzel L 05/09/2015 3:02 PM 

## 2015-05-09 NOTE — [facility] Group Notes (Signed)
[facility] LCSW Aftercare Discharge Planning Group Note   05/09/2015 11:20 AM  Participation Quality:  Appropriate   Mood/Affect:  Appropriate  Depression Rating:  3-4  Anxiety Rating:  0  Thoughts of Suicide:  No Will you contract for safety?   NA  Current AVH:  No  Plan for Discharge/Comments:  Pt reports that he has been abusing pain pills, which has increased since the death of his father a few months ago. Pt reports increased depression. He is set up with [facility] "But I've been dodging them." Pt is hoping to get set back up with the ACTT team and was given grief counseling info-[employer] [facility] hospice.   Transportation Means:  Parent/friend  Supports: mother, friend  Counselling psychologist, Oncologist

## 2015-05-09 NOTE — Progress Notes (Signed)
D: Patient in bed on first approach.  Patient states he had a good day.  Patient states he likes to spend time in bed watching television.  Patient states he is happy that he is started back on his Depakote for his seizures.  Patient states his withdrawal symptoms are minimal. Patient denies SI/HI and denies AVH. A: Staff to monitor Q 15 mins for safety.  Encouragement and support offered.  Scheduled medications administered per orders.  Trazodone administered prn for sleep.   R: Patient remains safe on the unit.  Patient did not attend group tonight.  Patient visible on the unit.  Patient taking administered medications.

## 2015-05-09 NOTE — [facility] Suicide Risk Assessment (Signed)
[name] Admission Suicide Risk Assessment   Nursing information obtained from:  Patient Demographic factors:  Male, Caucasian, Unemployed Current Mental Status:  Suicidal ideation indicated by patient, Self-harm thoughts Loss Factors:  Loss of significant relationship Historical Factors:  Family history of mental illness or substance abuse Risk Reduction Factors:  Sense of responsibility to family, Living with another person, especially a relative, Positive social support Total Time spent with patient: 45 minutes Principal Problem: Severe recurrent major depression without psychotic features Diagnosis:   Patient Active Problem List   Diagnosis Date Noted  . Opioid type dependence, continuous [F11.20] 05/09/2015  . Severe recurrent major depression without psychotic features [F33.2] 05/09/2015  . Polysubstance abuse [F19.10] 05/09/2015     Continued Clinical Symptoms:  Alcohol Use Disorder Identification Test Final Score (AUDIT): 0 The "Alcohol Use Disorders Identification Test", Guidelines for Use in Primary Care, Second Edition.  World Science writer [facility]). Score between 0-7:  no or low risk or alcohol related problems. Score between 8-15:  moderate risk of alcohol related problems. Score between 16-19:  high risk of alcohol related problems. Score 20 or above:  warrants further diagnostic evaluation for alcohol dependence and treatment.   CLINICAL FACTORS:   Depression:   Comorbid alcohol abuse/dependence Alcohol/Substance Abuse/Dependencies   Psychiatric Specialty Exam: Physical Exam  ROS  Blood pressure 125/81, pulse 77, temperature 97.7 F (36.5 C), temperature source Oral, resp. rate 16, height  (1.676 m), weight 80.74 kg (178 lb).Body mass index is 28.74 kg/(m^2).   COGNITIVE FEATURES THAT CONTRIBUTE TO RISK:  Closed-mindedness, Polarized thinking and Thought constriction (tunnel vision)    SUICIDE RISK:   Moderate:  Frequent suicidal ideation with limited  intensity, and duration, some specificity in terms of plans, no associated intent, good self-control, limited dysphoria/symptomatology, some risk factors present, and identifiable protective factors, including available and accessible social support.  PLAN OF CARE: Supportive approach/coping skills                               Depression; reassess for an antidepressant                               Work Grief and loss                                Opioid dependence/polysubstance abuse; clonidine detox                               Work a relapse prevention plan                               CBT/mindfulness  Medical Decision Making:  Review of Psycho-Social Stressors (1), Review or order clinical lab tests (1), Review of Medication Regimen & Side Effects (2) and Review of New Medication or Change in Dosage (2)  I certify that inpatient services furnished can reasonably be expected to improve the patient's condition.   Jacob Holmes A 05/09/2015, 4:47 PM

## 2015-05-09 NOTE — Progress Notes (Signed)
D: Patient reports some withdrawal symptoms such as chilling.  He rates his depression as a 4; anxiety as a 3; denies any hopelessness.  He has been attending groups and participating in his treatment. His goal is to "accept things in life that I can't change and get off pain pills.  I need to leave them alone."  Patient denies SI/HI/AVH. A: Continue to monitor medication management and MD orders.  Safety checks completed every 15 minutes per protocol.  Offer support and encouragement as needed. R: Patient's behavior is appropriate to situation.

## 2015-05-09 NOTE — [facility] Group Notes (Signed)
[facility] LCSW Group Therapy  05/09/2015 1:22 PM  Type of Therapy:  Group Therapy  Participation Level:  Active  Participation Quality:  Attentive  Affect:  Appropriate  Cognitive:  Alert and Oriented  Insight:  Improving  Engagement in Therapy:  Improving  Modes of Intervention:  Confrontation, Discussion, Education, Exploration, Problem-solving, Rapport Building, Socialization and Support  Summary of Progress/Problems: Emotion Regulation: This group focused on both positive and negative emotion identification and allowed group members to process ways to identify feelings, regulate negative emotions, and find healthy ways to manage internal/external emotions. Group members were asked to reflect on a time when their reaction to an emotion led to a negative outcome and explored how alternative responses using emotion regulation would have benefited them. Group members were also asked to discuss a time when emotion regulation was utilized when a negative emotion was experienced. Jacob Holmes was attentive and engaged during today's processing group. He stated that he struggles with grief and depression "since my dad died" and reports an increase in his opiate abuse since then. Jacob Holmes shared that he wants to learn how to get through his grief without using drugs in order to lead a healthier life. "I want to get set back up with [facility]." "I think that's going to help a lot." He was also given information to Hospice in [facility] for free grief counseling.   Smart, Shermon Bozzi LCSWA  05/09/2015, 1:22 PM
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
## 2015-05-09 NOTE — Progress Notes (Signed)
Pt did not attend NA group this evening.  

## 2015-05-10 MED ORDER — TRAZODONE HCL 100 MG PO TABS
100.0000 mg | ORAL_TABLET | Freq: Every evening | ORAL | Status: DC | PRN
  Administered 2015-05-10: 100 mg via ORAL
  Filled 2015-05-10: qty 1
  Filled 2015-05-10: qty 14

## 2015-05-10 NOTE — Progress Notes (Signed)
D: Patient in the hallway on approach.  Patient states he had a good day.  Patient state she had a great day.  Patient states his goal for today was to remain positive.  Patient states he met his goal today.  Patient states he is looking forward to going home tomorrow and spending time with his mother.  Patient denies SI/HI and denies AVH.     A: Staff to monitor Q 15 mins for safety.  Encouragement and support offered.  Scheduled medications administered per orders.  Trazodone administered prn for sleep.   R: Patient remains safe on the unit.  Patient attended group tonight.  Patient visible on the unit and interacting with peers.  Patient taking administered medications.

## 2015-05-10 NOTE — Progress Notes (Signed)
Patient attended Karaoke group tonight.  

## 2015-05-10 NOTE — [facility] Group Notes (Signed)
[facility] Group Notes:  (Nursing/MHT/Case Management/Adjunct)  Date:  05/10/2015  Time:  0930  Type of Therapy:  Nurse Education /  Leisure Time  : The group is focused on educating patients abut the importance of including leisure activities in their lives daily.  Participation Level:  Active  Participation Quality:  Appropriate  Affect:  Blunted  Cognitive:  Alert  Insight:  Appropriate  Engagement in Group:  Engaged  Modes of Intervention:  Discussion  Summary of Progress/Problems:  Rich Brave 05/10/2015, 10:37 AM

## 2015-05-10 NOTE — Progress Notes (Signed)
[facility] MD Progress Note  05/10/2015 4:05 PM ABRIAN HANOVER  MRN:  [id number] Subjective:  Jacob Holmes states he understands he is still grieving the death of his father. States he could not deal with it and went back to the Tramadol. States that he wants to see a therapist and be able to address his death. He is committed to abstaining. States he has his mother who needs him as well as his ex GF. He has been on Depakote for the longest time and this might have worked as a mood stabilize. Would rather wait before starting a new medication. States he did not sleep well last night as he could not handle the bed Principal Problem: Severe recurrent major depression without psychotic features Diagnosis:   Patient Active Problem List   Diagnosis Date Noted  . Opioid type dependence, continuous [F11.20] 05/09/2015  . Severe recurrent major depression without psychotic features [F33.2] 05/09/2015  . Polysubstance abuse [F19.10] 05/09/2015   Total Time spent with patient: 30 minutes   Past Medical History:  Past Medical History  Diagnosis Date  . Bipolar disorder   . Seizures   . Cardiomegaly     Past Surgical History  Procedure Laterality Date  . Hernia repair     Family History: History reviewed. No pertinent family history. Social History:  History  Alcohol Use No     History  Drug Use Not on file    Social History   Social History  . Marital Status: Single    Spouse Name: N/A  . Number of Children: N/A  . Years of Education: N/A   Social History Main Topics  . Smoking status: Current Every Day Smoker  . Smokeless tobacco: None  . Alcohol Use: No  . Drug Use: None  . Sexual Activity: Not Asked   Other Topics Concern  . None   Social History Narrative   Additional History:    Sleep: Poor  Appetite:  Fair   Assessment:   Musculoskeletal: Strength & Muscle Tone: within normal limits Gait & Station: normal Patient leans: normal   Psychiatric Specialty  Exam: Physical Exam  Review of Systems  Constitutional: Negative.   HENT: Negative.   Eyes: Negative.   Respiratory: Negative.   Cardiovascular: Negative.   Gastrointestinal: Negative.   Genitourinary: Negative.   Musculoskeletal: Negative.   Skin: Negative.   Neurological: Negative.   Endo/Heme/Allergies: Negative.   Psychiatric/Behavioral: Positive for depression and substance abuse. The patient is nervous/anxious.     Blood pressure 128/86, pulse 76, temperature 97.8 F (36.6 C), temperature source Oral, resp. rate 16, height 5\' 6"  (1.676 m), weight 80.74 kg (178 lb).Body mass index is 28.74 kg/(m^2).  General Appearance: Fairly Groomed  Patent attorney::  Fair  Speech:  Clear and Coherent  Volume:  Decreased  Mood:  Anxious and Depressed  Affect:  Restricted  Thought Process:  Coherent and Goal Directed  Orientation:  Full (Time, Place, and Person)  Thought Content:  symptoms events worries concerns  Suicidal Thoughts:  No  Homicidal Thoughts:  No  Memory:  Immediate;   Fair Recent;   Fair Remote;   Fair  Judgement:  Fair  Insight:  Present  Psychomotor Activity:  Restlessness  Concentration:  Fair  Recall:  [employer] of Knowledge:Fair  Language: Fair  Akathisia:  No  Handed:  Right  AIMS (if indicated):     Assets:  Desire for Improvement Housing Social Support  ADL's:  Intact  Cognition: WNL  Sleep:  Number of Hours: 3.75     Current Medications: Current Facility-Administered Medications  Medication Dose Route Frequency Provider Last Rate Last Dose  . acetaminophen (TYLENOL) tablet 650 mg  650 mg Oral Q6H PRN Kerry Hough, PA-C      . alum & mag hydroxide-simeth (MAALOX/MYLANTA) 200-200-20 MG/5ML suspension 30 mL  30 mL Oral Q4H PRN Kerry Hough, PA-C      . cloNIDine (CATAPRES) tablet 0.1 mg  0.1 mg Oral QID Kerry Hough, PA-C   0.1 mg at 05/10/15 1215   Followed by  . [START ON 05/11/2015] cloNIDine (CATAPRES) tablet 0.1 mg  0.1 mg Oral BH-qamhs  Spencer E Simon, PA-C       Followed by  . [START ON 05/14/2015] cloNIDine (CATAPRES) tablet 0.1 mg  0.1 mg Oral QAC breakfast Kerry Hough, PA-C      . dicyclomine (BENTYL) tablet 20 mg  20 mg Oral Q6H PRN Kerry Hough, PA-C      . divalproex (DEPAKOTE ER) 24 hr tablet 1,000 mg  1,000 mg Oral QHS Rachael Fee, MD   1,000 mg at 05/09/15 2135  . hydrOXYzine (ATARAX/VISTARIL) tablet 25 mg  25 mg Oral Q6H PRN Kerry Hough, PA-C   25 mg at 05/09/15 0913  . loperamide (IMODIUM) capsule 2-4 mg  2-4 mg Oral PRN Kerry Hough, PA-C      . magnesium hydroxide (MILK OF MAGNESIA) suspension 30 mL  30 mL Oral Daily PRN Kerry Hough, PA-C      . methocarbamol (ROBAXIN) tablet 500 mg  500 mg Oral Q8H PRN Kerry Hough, PA-C   500 mg at 05/09/15 9147  . naproxen (NAPROSYN) tablet 500 mg  500 mg Oral BID PRN Kerry Hough, PA-C      . nicotine polacrilex (NICORETTE) gum 2 mg  2 mg Oral PRN Rachael Fee, MD      . ondansetron (ZOFRAN-ODT) disintegrating tablet 4 mg  4 mg Oral Q6H PRN Kerry Hough, PA-C      . traZODone (DESYREL) tablet 50 mg  50 mg Oral QHS PRN Kerry Hough, PA-C   50 mg at 05/09/15 2135    Lab Results: No results found for this or any previous visit (from the past 48 hour(s)).  Physical Findings: AIMS: Facial and Oral Movements Muscles of Facial Expression: None, normal Lips and Perioral Area: None, normal Jaw: None, normal Tongue: None, normal,Extremity Movements Upper (arms, wrists, hands, fingers): None, normal Lower (legs, knees, ankles, toes): None, normal, Trunk Movements Neck, shoulders, hips: None, normal, Overall Severity Severity of abnormal movements (highest score from questions above): None, normal Incapacitation due to abnormal movements: None, normal Patient's awareness of abnormal movements (rate only patient's report): No Awareness, Dental Status Current problems with teeth and/or dentures?: Yes Does patient usually wear dentures?: No  CIWA:     COWS:  COWS Total Score: 2  Treatment Plan Summary: Daily contact with patient to assess and evaluate symptoms and progress in treatment and Medication management Supportive approach/coping skills Polysubstance abuse; work a relapse prevention plan Depression; work with grief and loss Reassess for the need of an antidepressant ( he would rather not start an antidepressant now Seizures; continue Depakote 1000 mg HS Insomnia; will increase the Trazodone to 100 mg HS Use CBT/mindfulness  Medical Decision Making:  Review of Psycho-Social Stressors (1) and Review of Medication Regimen & Side Effects (2)     Theoren Palka A 05/10/2015, 4:05 PM

## 2015-05-10 NOTE — [facility] Group Notes (Signed)
[facility] LCSW Group Therapy  05/10/2015 12:23 PM  Type of Therapy:  Group Therapy  Participation Level:  Active  Participation Quality:  Attentive  Affect:  Appropriate  Cognitive: Clear, organized   Insight:  Engaged  Engagement in Therapy:  Improving  Modes of Intervention:  Confrontation, Discussion, Education, Exploration, Problem-solving, Rapport Building, Socialization and Support  Summary of Progress/Problems:  Finding Balance in Life. Today's group focused on defining balance in one's own words, identifying things that can knock one off balance, and exploring healthy ways to maintain balance in life. Group members were asked to provide an example of a time when they felt off balance, describe how they handled that situation,and process healthier ways to regain balance in the future. Group members were asked to share the most important tool for maintaining balance that they learned while at [facility] and how they plan to apply this method after discharge. Jacob Holmes was attentive and engaged during today's processing group. He shared that he wants to get help for grief and thinks that dealing with the death of his father will ease his depression and help him avoid relapse. Jacob Holmes is interested in Hospice grief counseling and plans to call upon discharge.   Smart, [name] LCSWA  05/10/2015, 12:23 PM

## 2015-05-10 NOTE — Progress Notes (Signed)
D: Patient has been isolating to room.  He denies any withdrawal symptoms or SI/HI/AVH.  Patient was questioning when he would be leaving.  He states, "I have an ACT team and I would like to just follow up with them."  He denies SI/HI/AVH.  He presents with flat, blunted affect.  He rates his depression as a 2; hopelessness and anxiety as a 0. A: Continue to monitor medication management and MD orders.  Safety checks completed every 15 minutes per protocol.  Offer support and encouragement as needed. R: Patient's behavior is appropriate to situation.

## 2015-05-10 NOTE — [facility] Suicide Risk Assessment (Signed)
[facility] INPATIENT:  Family/Significant Other Suicide Prevention Education  Suicide Prevention Education:  Contact Attempts: Sunny Gains (pt's mother) [phone] has been identified by the patient as the family member/significant other with whom the patient will be residing, and identified as the person(s) who will aid the patient in the event of a mental health crisis.  With written consent from the patient, two attempts were made to provide suicide prevention education, prior to and/or following the patient's discharge.  We were unsuccessful in providing suicide prevention education.  A suicide education pamphlet was given to the patient to share with family/significant other.  Date and time of first attempt: 05/09/15 at 4:05PM (voicemail left requesting call back)  Date and time of second attempt: 05/10/15 at 10:55AM (voicemail left requesting call back)   Smart, Malaiya Paczkowski LCSWA  05/10/2015, 10:56 AM

## 2015-05-10 NOTE — Clinical Social Work Note (Signed)
CSW spoke with [facility]-pt was dropped from CST services last month due to not following up or communicating with team for several weeks. [facility] referral form completed by CSW and faxed in this morning.  Trula Slade, [employer] Clinical Social Worker 05/10/2015 8:25 AM

## 2015-05-11 DIAGNOSIS — F329 Major depressive disorder, single episode, unspecified: Secondary | ICD-10-CM | POA: Diagnosis present

## 2015-05-11 MED ORDER — TRAZODONE HCL 100 MG PO TABS: 100.0000 mg | ORAL_TABLET | Freq: Every evening | ORAL | Status: DC | PRN

## 2015-05-11 MED ORDER — DIVALPROEX SODIUM ER 500 MG PO TB24
1000.0000 mg | ORAL_TABLET | Freq: Every day | ORAL | Status: DC
Start: 2015-05-11 — End: 2019-08-10

## 2015-05-11 MED ORDER — HYDROXYZINE HCL 25 MG PO TABS: 25.0000 mg | ORAL_TABLET | Freq: Four times a day (QID) | ORAL | Status: DC | PRN

## 2015-05-11 NOTE — Plan of Care (Signed)
Problem: Diagnosis: Increased Risk For Suicide Attempt Goal: LTG-Patient Will Report Improved Mood and Deny Suicidal LTG (by discharge) Patient will report improved mood and deny suicidal ideation.  Outcome: Progressing Patient denies SI.  Patient verbally contracts for safety.     

## 2015-05-11 NOTE — Plan of Care (Signed)
Problem: Alteration in mood Goal: LTG-Pt's behavior demonstrates decreased signs of depression (Patient's behavior demonstrates decreased signs of depression to the point the patient is safe to return home and continue treatment in an outpatient setting)  Outcome: Progressing Patient states his depression is minimal.  Patient states, "I'm just bored."

## 2015-05-11 NOTE — Progress Notes (Signed)
Discharged Note : Discharge instructions/medications/follow up appointments discussed with pt. Prescriptions given, samples given, and patients belongings returned to pt. Pt will be returning home with mother and girlfriend, girlfriend's ex-husband picking up patient. Pt verbalizes understanding.  Pt denies SI/HI/AVH.

## 2015-05-11 NOTE — Progress Notes (Signed)
  [facility] Adult Case Management Discharge Plan :  Will you be returning to the same living situation after discharge:  Yes,  home with mom and gf At discharge, do you have transportation home?: Yes,  gf's exhusband Do you have the ability to pay for your medications: Yes,  medicaid  Release of information consent forms completed and submitted to medical records by CSW.  Patient to Follow up at: Follow-up Information    Follow up with [facility] .   Why:  Referral for CST/ACT submitted on 05/10/15. Please follow-up if still interested in these services if you decide not to use [facility] as your provider.    Contact information:   [address] [place], [place] [address] Phone: [phone] Fax: [phone]      Follow up with [facility].   Why:  Walk in Monday,Wednesday,Friday between 8am-3pm for assessment (medication management, therapy, CST). Please bring photo ID and Medicaid card.    Contact information:   [address] [place], [place] [address] Phone: [phone] Fax: [phone]      Patient denies SI/HI: Yes,  during group/self report.     Safety Planning and Suicide Prevention discussed: Yes,  SPE completed with pt. contact attempts made with pt's mother.   Have you used any form of tobacco in the last 30 days? (Cigarettes, Smokeless Tobacco, Cigars, and/or Pipes): Yes  Has patient been referred to the Quitline?: Patient refused referral  Jacob Holmes, Jacob Holmes  05/11/2015, 9:50 AM

## 2015-05-11 NOTE — Discharge Summary (Signed)
Physician Discharge Summary Note  Patient:  Jacob Holmes is an 39 y.o., male MRN:  [id number] DOB:  July 09, 1976 Patient phone:  [phone] (home)  Patient address:   [address] [place] [address],  Total Time spent with patient: 45 minutes  Date of Admission:  05/08/2015 Date of Discharge: 05/11/2015  Reason for Admission:  depression  Principal Problem: Severe recurrent major depression without psychotic features Discharge Diagnoses: Patient Active Problem List   Diagnosis Date Noted  . Major depression [F32.2] 05/11/2015  . Opioid type dependence, continuous [F11.20] 05/09/2015  . Severe recurrent major depression without psychotic features [F33.2] 05/09/2015  . Polysubstance abuse [F19.10] 05/09/2015    Musculoskeletal: Strength & Muscle Tone: within normal limits Gait & Station: normal Patient leans: N/A  Psychiatric Specialty Exam: Physical Exam  Vitals reviewed.   Review of Systems  Constitutional: Negative for fever.  Eyes: Negative for blurred vision.  Cardiovascular: Negative for chest pain.  Skin: Negative for rash.  Neurological: Negative for dizziness, tingling and headaches.  Psychiatric/Behavioral: Negative for depression. The patient is not nervous/anxious.   All other systems reviewed and are negative.   Blood pressure 134/88, pulse 75, temperature 98.3 F (36.8 C), temperature source Oral, resp. rate 18, height 5\' 6"  (1.676 m), weight 80.74 kg (178 lb).Body mass index is 28.74 kg/(m^2).   General Appearance: Fairly Groomed  Patent attorney:: Fair  Speech: Clear and Coherent  Volume: Normal  Mood: Euthymic  Affect: Appropriate  Thought Process: Coherent and Goal Directed  Orientation: Full (Time, Place, and Person)  Thought Content: plans as he moves on, relapse prevention plan  Suicidal Thoughts: No  Homicidal Thoughts: No  Memory: Immediate; Fair Recent; Fair Remote; Fair  Judgement: Fair   Insight: Present  Psychomotor Activity: Normal  Concentration: Fair  Recall: [employer] of Knowledge:Fair  Language: Fair  Akathisia: No  Handed: Right  AIMS (if indicated):    Assets: Desire for Improvement Housing Social Support  Sleep: Number of Hours: 3.75  Cognition: WNL  ADL's: Intact       Have you used any form of tobacco in the last 30 days? (Cigarettes, Smokeless Tobacco, Cigars, and/or Pipes): Yes  Has this patient used any form of tobacco in the last 30 days? (Cigarettes, Smokeless Tobacco, Cigars, and/or Pipes) N/A  Past Medical History:  Past Medical History  Diagnosis Date  . Bipolar disorder   . Seizures   . Cardiomegaly     Past Surgical History  Procedure Laterality Date  . Hernia repair     Family History: History reviewed. No pertinent family history. Social History:  History  Alcohol Use No     History  Drug Use Not on file    Social History   Social History  . Marital Status: Single    Spouse Name: N/A  . Number of Children: N/A  . Years of Education: N/A   Social History Main Topics  . Smoking status: Current Every Day Smoker  . Smokeless tobacco: None  . Alcohol Use: No  . Drug Use: None  . Sexual Activity: Not Asked   Other Topics Concern  . None   Social History Narrative   Risk to Self: Is patient at risk for suicide?: Yes What has been your use of drugs/alcohol within the last 12 months?: pain pills/opiate abuse-for the past year ---amount of use increased after his father's death in 04-09-15. up to 100mg  tramadol every few days. some marijuana abuse.  Risk to Others:  Prior Inpatient Therapy:   Prior Outpatient Therapy:    Level of Care:  OP  Hospital Course:  Jacob Holmes was admitted for Severe recurrent major depression without psychotic features and crisis management.  He was treated discharged with the medications listed below under Medication List.  Medical problems were  identified and treated as needed.  Home medications were restarted as appropriate.  Improvement was monitored by observation and Evangeline Gula daily report of symptom reduction.  Emotional and mental status was monitored by daily self-inventory reports completed by Evangeline Gula and clinical staff.         Jacob Holmes was evaluated by the treatment team for stability and plans for continued recovery upon discharge.  Jacob Holmes motivation was an integral factor for scheduling further treatment.  Employment, transportation, bed availability, health status, family support, and any pending legal issues were also considered during his hospital stay.  He was offered further treatment options upon discharge including but not limited to Residential, Intensive Outpatient, and Outpatient treatment.  Jacob Holmes will follow up with the services as listed below under Follow Up Information.     Upon completion of this admission the patient was both mentally and medically stable for discharge denying suicidal/homicidal ideation, auditory/visual/tactile hallucinations, delusional thoughts and paranoia.      Consults:  psychiatry  Significant Diagnostic Studies:  labs: per ED  Discharge Vitals:   Blood pressure 134/88, pulse 75, temperature 98.3 F (36.8 C), temperature source Oral, resp. rate 18, height 5\' 6"  (1.676 m), weight 80.74 kg (178 lb). Body mass index is 28.74 kg/(m^2). Lab Results:   No results found for this or any previous visit (from the past 72 hour(s)).  Physical Findings: AIMS: Facial and Oral Movements Muscles of Facial Expression: None, normal Lips and Perioral Area: None, normal Jaw: None, normal Tongue: None, normal,Extremity Movements Upper (arms, wrists, hands, fingers): None, normal Lower (legs, knees, ankles, toes): None, normal, Trunk Movements Neck, shoulders, hips: None, normal, Overall Severity Severity of abnormal movements (highest  score from questions above): None, normal Incapacitation due to abnormal movements: None, normal Patient's awareness of abnormal movements (rate only patient's report): No Awareness, Dental Status Current problems with teeth and/or dentures?: Yes Does patient usually wear dentures?: No  CIWA:    COWS:  COWS Total Score: 0   See Psychiatric Specialty Exam and Suicide Risk Assessment completed by Attending Physician prior to discharge.  Discharge destination:  Home  Is patient on multiple antipsychotic therapies at discharge:  No   Has Patient had three or more failed trials of antipsychotic monotherapy by history:  No  Recommended Plan for Multiple Antipsychotic Therapies: NA    Medication List    STOP taking these medications        BC HEADACHE POWDER PO     divalproex 500 MG DR tablet  Commonly known as:  DEPAKOTE  Replaced by:  divalproex 500 MG 24 hr tablet     ibuprofen 200 MG tablet  Commonly known as:  ADVIL,MOTRIN      TAKE these medications      Indication   divalproex 500 MG 24 hr tablet  Commonly known as:  DEPAKOTE ER  Take 2 tablets (1,000 mg total) by mouth at bedtime.   Indication:  mood stabilization     hydrOXYzine 25 MG tablet  Commonly known as:  ATARAX/VISTARIL  Take 1 tablet (25 mg total) by mouth every 6 (six) hours as needed for anxiety.  traZODone 100 MG tablet  Commonly known as:  DESYREL  Take 1 tablet (100 mg total) by mouth at bedtime as needed for sleep.   Indication:  Trouble Sleeping           Follow-up Information    Follow up with [facility]) .   Why:  Referral for CST/ACT submitted on 05/10/15. Please follow-up if still interested in these services if you decide not to use [facility] as your provider.    Contact information:   [address] [place], [place] [address] Phone: [phone] Fax: [phone]      Follow up with [facility].   Why:  Walk in Monday,Wednesday,Friday between 8am-3pm for assessment  (medication management, therapy, CST). Please bring photo ID and Medicaid card.    Contact information:   [address] [place], [place] [address] Phone: [phone] Fax: [phone]     Follow-up recommendations:  Activity:  as tol, diet as tol  Comments:  1.  Take all your medications as prescribed.              2.  Report any adverse side effects to outpatient provider.                       3.  Patient instructed to not use alcohol or illegal drugs while on prescription medicines.            4.  In the event of worsening symptoms, instructed patient to call 911, the crisis hotline or go to nearest emergency room for evaluation of symptoms.  Total Discharge Time:  40 min  Signed: Velna Hatchet May Agustin AGNP-BC 05/11/2015, 4:32 PM  I personally assessed the patient and formulated the plan Madie Reno A. Dub Mikes, M.D.

## 2015-05-11 NOTE — [facility] Suicide Risk Assessment (Signed)
[facility] Discharge Suicide Risk Assessment   Demographic Factors:  Male and Caucasian  Total Time spent with patient: 30 minutes  Musculoskeletal: Strength & Muscle Tone: within normal limits Gait & Station: normal Patient leans: normal  Psychiatric Specialty Exam: Physical Exam  Review of Systems  Constitutional: Negative.   HENT: Negative.   Eyes: Negative.   Respiratory: Negative.   Cardiovascular: Negative.   Gastrointestinal: Negative.   Genitourinary: Negative.   Musculoskeletal: Negative.   Skin: Negative.   Neurological: Negative.   Endo/Heme/Allergies: Negative.   Psychiatric/Behavioral: Positive for depression.    Blood pressure 134/88, pulse 75, temperature 98.3 F (36.8 C), temperature source Oral, resp. rate 18, height  (1.676 m), weight 80.74 kg (178 lb).Body mass index is 28.74 kg/(m^2).  General Appearance: Fairly Groomed  Patent attorney::  Fair  Speech:  Clear and Coherent409  Volume:  Normal  Mood:  Euthymic  Affect:  Appropriate  Thought Process:  Coherent and Goal Directed  Orientation:  Full (Time, Place, and Person)  Thought Content:  plans as he moves on, relapse prevention plan  Suicidal Thoughts:  No  Homicidal Thoughts:  No  Memory:  Immediate;   Fair Recent;   Fair Remote;   Fair  Judgement:  Fair  Insight:  Present  Psychomotor Activity:  Normal  Concentration:  Fair  Recall:  [employer] of Knowledge:Fair  Language: Fair  Akathisia:  No  Handed:  Right  AIMS (if indicated):     Assets:  Desire for Improvement Housing Social Support  Sleep:  Number of Hours: 3.75  Cognition: WNL  ADL's:  Intact   Have you used any form of tobacco in the last 30 days? (Cigarettes, Smokeless Tobacco, Cigars, and/or Pipes): Yes  Has this patient used any form of tobacco in the last 30 days? (Cigarettes, Smokeless Tobacco, Cigars, and/or Pipes) Yes, A prescription for an FDA-approved tobacco cessation medication was offered at discharge and the  patient refused  Mental Status Per Nursing Assessment::   On Admission:  Suicidal ideation indicated by patient, Self-harm thoughts  Current Mental Status by Physician: In full contact with reality. There are no active S/S of withdrawal. There are no active SI plans or intent. He is willing to continue to follow up outpatient basis. He is going to pursue grief counseling his mother has agreed to go with him   Loss Factors: Loss of significant relationship  Historical Factors: NA  Risk Reduction Factors:   Sense of responsibility to family, Living with another person, especially a relative and Positive social support  Continued Clinical Symptoms:  Depression:   Comorbid alcohol abuse/dependence  Cognitive Features That Contribute To Risk:  Closed-mindedness, Polarized thinking and Thought constriction (tunnel vision)    Suicide Risk:  Minimal: No identifiable suicidal ideation.  Patients presenting with no risk factors but with morbid ruminations; may be classified as minimal risk based on the severity of the depressive symptoms  Principal Problem: Severe recurrent major depression without psychotic features Discharge Diagnoses:  Patient Active Problem List   Diagnosis Date Noted  . Major depression [F32.2] 05/11/2015  . Opioid type dependence, continuous [F11.20] 05/09/2015  . Severe recurrent major depression without psychotic features [F33.2] 05/09/2015  . Polysubstance abuse [F19.10] 05/09/2015    Follow-up Information    Follow [facility] with [facility] .   Why:  Referral for CST/ACT submitted on 05/10/15. Please follow-up if still interested in these services if you decide not to use [facility] as your provider.  Contact information:   [address] [place], [place] [address] Phone: [phone] Fax: [phone]      Follow up with [facility].   Why:  Walk in Monday,Wednesday,Friday between 8am-3pm for assessment (medication management, therapy, CST). Please bring  photo ID and Medicaid card.    Contact information:   [address] [place], [place] [address] Phone: [phone] Fax: [phone]      Plan Of Care/Follow-up recommendations:  Activity:  as tolerated Diet:  regular Follow up [facility] and [facility] as above Is patient on multiple antipsychotic therapies at discharge:  No   Has Patient had three or more failed trials of antipsychotic monotherapy by history:  No  Recommended Plan for Multiple Antipsychotic Therapies: NA    Ryenn Howeth A 05/11/2015, 12:04 PM

## 2015-05-11 NOTE — Tx Team (Signed)
Interdisciplinary Treatment Plan Update (Adult)  Date:  05/11/2015  Time Reviewed:  8:32 AM   Progress in Treatment: Attending groups: Yes. Participating in groups:  Yes. Taking medication as prescribed:  Yes. Tolerating medication:  Yes. Family/Significant othe contact made:  Contact attempts made with pt's mother/friend. SPE completed with pt.  Patient understands diagnosis:  Yes. and As evidenced by:  seeking treatment for opiate abuse, depression, SI, and for medication stabilization. Discussing patient identified problems/goals with staff:  Yes. Medical problems stabilized or resolved:  Yes. Denies suicidal/homicidal ideation: Yes. Other:  Discharge Plan or Barriers: [facility] assessment appt made. [facility] referral sent. Pt to followup after discharge. Pt plans to contact hospice in [place] to get set up with grief counselor. Information provided.   Reason for Continuation of Hospitalization: none  Comments:   Jacob Holmes is an 39 y.o. male. Mr. Jacob Holmes reports that he arrived to the ED seeking assistance. He states that "I been real depressed for quite a few months. My father passed away about a month and a half ago". He states that he does not see the point of life anymore. "I just quit everything and have given up on life. I can't keep going on like this". Mr. Jacob Holmes denied anxiety symptoms. He denied having auditory or visual hallucinations. He denied homicidal ideation or intent. He expressed suicidal ideation, intent, and his plan to overdose. Axis I: Bipolar, mixed  Estimated length of stay:  D/c today   Additional Comments:  Patient and CSW reviewed pt's identified goals and treatment plan. Patient verbalized understanding and agreed to treatment plan. CSW reviewed [facility] "Discharge Process and Patient Involvement" Form. Pt verbalized understanding of information provided and signed form.    Review of initial/current patient goals per problem list:  1. Goal(s):  Patient will participate in aftercare plan  Met: Yes   Target date: at discharge  As evidenced by: Patient will participate within aftercare plan AEB aftercare provider and housing plan at discharge being identified.  8/17: CSW assessing. Pt wants to reconnect with [facility] and return home.   8/19: Pt set up with [facility] for CST. [facility] referral sent and pt will followup. Hospice grief counseling as well.   2. Goal (s): Patient will exhibit decreased depressive symptoms and suicidal ideations.  Met: Yes    Target date: at discharge  As evidenced by: Patient will utilize self rating of depression at 3 or below and demonstrate decreased signs of depression or be deemed stable for discharge by MD.  8/17: Pt rates depression as 4/10 and presents with depressed mood.   8/19: Pt rates depression as 2/10 and presents with pleasant mood/calm affect. Reports that he is ready to d/c.   3. Goal(s): Patient will demonstrate decreased signs of withdrawal due to substance abuse  Met:Yes   Target date:at discharge   As evidenced by: Patient will produce a CIWA/COWS score of 0, have stable vitals signs, and no symptoms of withdrawal.  8/17: Pt is on detox protocol. Reporting minimal withdrawal sx at this time with stable vitals. Goal progressing.   8/19: Pt reports no signs of withdrawal with COWS of 0 and stable vitals.    Attendees: Patient:   05/11/2015 8:32 AM   Family:   05/11/2015 8:32 AM   Physician:  Dr. Carlton Adam, MD 05/11/2015 8:32 AM   Nursing:   Gaspar Cola RN 05/11/2015 8:32 AM   Clinical Social Worker: Maxie Better, [place]  05/11/2015 8:32 AM   Clinical Social  Worker: Erasmo Downer Drinkard LCSWA; Peri Maris LCSWA 05/11/2015 8:32 AM   Other:  Gerline Legacy Nurse Case Manager 05/11/2015 8:32 AM   Other:  Lucinda Dell; [facility]  05/11/2015 8:32 AM   Other:   05/11/2015 8:32 AM   Other:  05/11/2015 8:32 AM   Other:  05/11/2015 8:32 AM   Other:  05/11/2015 8:32 AM     05/11/2015 8:32 AM    05/11/2015 8:32 AM    05/11/2015 8:32 AM    05/11/2015 8:32 AM    Scribe for Treatment Team:   Maxie Better, [place]  05/11/2015 8:32 AM

## 2015-10-19 ENCOUNTER — Emergency Department
Admission: EM | Admit: 2015-10-19 | Discharge: 2015-10-19 | Disposition: A | Discharge status: Home / Self Care | Payer: Medicaid Other | Attending: Emergency Medicine | Admitting: Emergency Medicine

## 2015-10-19 DIAGNOSIS — F17200 Nicotine dependence, unspecified, uncomplicated: Secondary | ICD-10-CM | POA: Diagnosis not present

## 2015-10-19 DIAGNOSIS — Z79899 Other long term (current) drug therapy: Secondary | ICD-10-CM | POA: Insufficient documentation

## 2015-10-19 DIAGNOSIS — M542 Cervicalgia: Secondary | ICD-10-CM | POA: Diagnosis present

## 2015-10-19 DIAGNOSIS — M436 Torticollis: Secondary | ICD-10-CM | POA: Insufficient documentation

## 2015-10-19 DIAGNOSIS — E119 Type 2 diabetes mellitus without complications: Secondary | ICD-10-CM | POA: Insufficient documentation

## 2015-10-19 HISTORY — DX: Type 2 diabetes mellitus without complications: E11.9

## 2015-10-19 MED ORDER — DIAZEPAM 2 MG PO TABS: 2.0000 mg | ORAL_TABLET | Freq: Three times a day (TID) | ORAL | Status: DC | PRN

## 2015-10-19 MED ORDER — KETOROLAC TROMETHAMINE 10 MG PO TABS: 10.0000 mg | ORAL_TABLET | Freq: Four times a day (QID) | ORAL | Status: DC | PRN

## 2015-10-19 MED ORDER — KETOROLAC TROMETHAMINE 60 MG/2ML IM SOLN
60.0000 mg | Freq: Once | INTRAMUSCULAR | Status: AC
  Administered 2015-10-19: 60 mg via INTRAMUSCULAR
  Filled 2015-10-19: qty 2

## 2015-10-19 MED ORDER — DIAZEPAM 5 MG PO TABS
5.0000 mg | ORAL_TABLET | Freq: Once | ORAL | Status: AC
  Administered 2015-10-19: 5 mg via ORAL
  Filled 2015-10-19: qty 1

## 2015-10-19 NOTE — Discharge Instructions (Signed)
Acute Torticollis °Torticollis is a condition in which the muscles of the neck tighten (contract) abnormally, causing the neck to twist and the head to move into an unnatural position. Torticollis that develops suddenly is called acute torticollis. If torticollis becomes chronic and is left untreated, the face and neck can become deformed. °CAUSES °This condition may be caused by: °· Sleeping in an awkward position (common). °· Extending or twisting the neck muscles beyond their normal position. °· Infection. °In some cases, the cause may not be known. °SYMPTOMS °Symptoms of this condition include: °· An unnatural position of the head. °· Neck pain. °· A limited ability to move the neck. °· Twisting of the neck to one side. °DIAGNOSIS °This condition is diagnosed with a physical exam. You may also have imaging tests, such as an X-ray, CT scan, or MRI. °TREATMENT °Treatment for this condition involves trying to relax the neck muscles. It may include: °· Medicines or shots. °· Physical therapy. °· Surgery. This may be done in severe cases. °HOME CARE INSTRUCTIONS °· Take medicines only as directed by your health care provider. °· Do stretching exercises and massage your neck as directed by your health care provider. °· Keep all follow-up visits as directed by your health care provider. This is important. °SEEK MEDICAL CARE IF: °· You develop a fever. °SEEK IMMEDIATE MEDICAL CARE IF: °· You develop difficulty breathing. °· You develop noisy breathing (stridor). °· You start drooling. °· You have trouble swallowing or have pain with swallowing. °· You develop numbness or weakness in your hands or feet. °· You have changes in your speech, understanding, or vision. °· Your pain gets worse. °  °This information is not intended to replace advice given to you by your health care provider. Make sure you discuss any questions you have with your health care provider. °  °Document Released: 09/05/2000 Document Revised:  01/23/2015 Document Reviewed: 09/04/2014 °Elsevier Interactive Patient Education ©2016 Elsevier Inc. ° °

## 2015-10-19 NOTE — ED Notes (Signed)
States he developed some pain and stiffness to neck after working on something yesterday   States pain is mainly on the right side and into right arm

## 2015-10-19 NOTE — ED Notes (Signed)
Pt states he was working on something in the bathroom yesterday and has been having lot of pain in his neck since.. States he is unable to turn it from side to side or up and down.Marland Kitchen

## 2015-10-19 NOTE — ED Provider Notes (Signed)
[facility] Emergency Department Provider Note ____________________________________________  Time seen: Approximately 2:58 PM  I have reviewed the triage vital signs and the nursing notes.   HISTORY  Chief Complaint Neck Pain    HPI Jacob Holmes is a 40 y.o. male who presents to the emergency department for evaluation of pain and stiffness in his neck after working on a door hinge yesterday. No specific injury. He states he was unable to sleep last night due to pain. He has had no relief with tylenol or BC Powder. He states he is unable to turn his head to either side without severe pain.  Past Medical History  Diagnosis Date  . Bipolar disorder (HCC)   . Seizures (HCC)   . Cardiomegaly   . Diabetes mellitus without complication [facility])     Patient Active Problem List   Diagnosis Date Noted  . Major depression (HCC) 05/11/2015  . Opioid type dependence, continuous (HCC) 05/09/2015  . Severe recurrent major depression without psychotic features (HCC) 05/09/2015  . Polysubstance abuse 05/09/2015    Past Surgical History  Procedure Laterality Date  . Hernia repair      Current Outpatient Rx  Name  Route  Sig  Dispense  Refill  . divalproex (DEPAKOTE ER) 500 MG 24 hr tablet   Oral   Take 2 tablets (1,000 mg total) by mouth at bedtime.   60 tablet   0   . sitaGLIPtin (JANUVIA) 50 MG tablet   Oral   Take 50 mg by mouth daily.         . diazepam (VALIUM) 2 MG tablet   Oral   Take 1 tablet (2 mg total) by mouth every 8 (eight) hours as needed.   12 tablet   0   . hydrOXYzine (ATARAX/VISTARIL) 25 MG tablet   Oral   Take 1 tablet (25 mg total) by mouth every 6 (six) hours as needed for anxiety.   30 tablet   0   . ketorolac (TORADOL) 10 MG tablet   Oral   Take 1 tablet (10 mg total) by mouth every 6 (six) hours as needed.   20 tablet   0   . traZODone (DESYREL) 100 MG tablet   Oral   Take 1 tablet (100 mg total) by mouth at  bedtime as needed for sleep.   30 tablet   0     Allergies Review of patient's allergies indicates no known allergies.  No family history on file.  Social History Social History  Substance Use Topics  . Smoking status: Current Every Day Smoker  . Smokeless tobacco: None  . Alcohol Use: No    Review of Systems Constitutional: No recent illness or specific injury. Eyes: No visual changes. ENT: No sore throat. Cardiovascular: Denies chest pain or palpitations. Respiratory: Denies shortness of breath. Gastrointestinal: No abdominal pain.  Genitourinary: Negative for dysuria. Musculoskeletal: Pain in neck on right side with radiation into right arm. Skin: Negative for rash. Neurological: Negative for headaches, focal weakness or numbness. 10-point ROS otherwise unremarkable.  ____________________________________________   PHYSICAL EXAM:  VITAL SIGNS: ED Triage Vitals  Enc Vitals Group     BP 10/19/15 1340 152/108 mmHg     Pulse Rate 10/19/15 1340 85     Resp 10/19/15 1340 17     Temp 10/19/15 1340 98.2 F (36.8 C)     Temp Source 10/19/15 1340 Oral     SpO2 10/19/15 1340 98 %     Weight  10/19/15 1342 185 lb (83.915 kg)     Height 10/19/15 1342  (1.676 m)     Head Cir --      Peak Flow --      Pain Score 10/19/15 1342 10     Pain Loc --      Pain Edu? --      Excl. in GC? --     Constitutional: Alert and oriented. Well appearing and in no acute distress. Eyes: Conjunctivae are normal. EOMI. Head: Atraumatic. Nose: No congestion/rhinnorhea. Neck: No stridor.  Respiratory: Normal respiratory effort.   Musculoskeletal: Tenderness to palpation of the right cervical paraspinal muscles. Full ROM of bilateral arms. Neurologic:  Normal speech and language. No gross focal neurologic deficits are appreciated. Grip strength equal. No sensation discrepancy. Speech is normal. No gait instability. Skin:  Skin is warm, dry and intact. Atraumatic. Psychiatric: Mood  and affect are normal. Speech and behavior are normal.  ____________________________________________   LABS (all labs ordered are listed, but only abnormal results are displayed)  Labs Reviewed - No data to display ____________________________________________  RADIOLOGY  Not indicated. ____________________________________________   PROCEDURES  Procedure(s) performed: None   ____________________________________________   INITIAL IMPRESSION / ASSESSMENT AND PLAN / ED COURSE  Pertinent labs & imaging results that were available during my care of the patient were reviewed by me and considered in my medical decision making (see chart for details).  Likely torticollis. Will give Toradol IM and valium po then reassess.  ----------------------------------------- 3:33 PM on 10/19/2015 -----------------------------------------  Patient lying on stretcher resting with eyes closed. Reports pain is easing. Will discharge home with toradol and valium. He is to follow up with his PCP for symptoms that are not improving over the week. ____________________________________________   FINAL CLINICAL IMPRESSION(S) / ED DIAGNOSES  Final diagnoses:  Acute torticollis       Chinita Pester, FNP 10/19/15 1534  Jeanmarie Plant, MD 10/19/15 1730

## 2015-12-24 ENCOUNTER — Emergency Department
Admission: EM | Admit: 2015-12-24 | Discharge: 2015-12-24 | Disposition: A | Discharge status: Home / Self Care | Payer: Medicaid Other | Attending: Emergency Medicine | Admitting: Emergency Medicine

## 2015-12-24 ENCOUNTER — Emergency Department: Payer: Medicaid Other

## 2015-12-24 DIAGNOSIS — Y9389 Activity, other specified: Secondary | ICD-10-CM | POA: Insufficient documentation

## 2015-12-24 DIAGNOSIS — Z79899 Other long term (current) drug therapy: Secondary | ICD-10-CM | POA: Diagnosis not present

## 2015-12-24 DIAGNOSIS — H9311 Tinnitus, right ear: Secondary | ICD-10-CM | POA: Diagnosis not present

## 2015-12-24 DIAGNOSIS — Y92410 Unspecified street and highway as the place of occurrence of the external cause: Secondary | ICD-10-CM | POA: Diagnosis not present

## 2015-12-24 DIAGNOSIS — I517 Cardiomegaly: Secondary | ICD-10-CM | POA: Diagnosis not present

## 2015-12-24 DIAGNOSIS — S5012XA Contusion of left forearm, initial encounter: Secondary | ICD-10-CM | POA: Diagnosis not present

## 2015-12-24 DIAGNOSIS — E119 Type 2 diabetes mellitus without complications: Secondary | ICD-10-CM | POA: Insufficient documentation

## 2015-12-24 DIAGNOSIS — F332 Major depressive disorder, recurrent severe without psychotic features: Secondary | ICD-10-CM | POA: Insufficient documentation

## 2015-12-24 DIAGNOSIS — Y999 Unspecified external cause status: Secondary | ICD-10-CM | POA: Insufficient documentation

## 2015-12-24 DIAGNOSIS — F17200 Nicotine dependence, unspecified, uncomplicated: Secondary | ICD-10-CM | POA: Diagnosis not present

## 2015-12-24 DIAGNOSIS — M79632 Pain in left forearm: Secondary | ICD-10-CM | POA: Diagnosis present

## 2015-12-24 NOTE — Discharge Instructions (Signed)
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
## 2016-08-24 ENCOUNTER — Encounter: Payer: Self-pay | Admitting: *Deleted

## 2016-08-24 ENCOUNTER — Emergency Department
Admission: EM | Admit: 2016-08-24 | Discharge: 2016-08-24 | Disposition: A | Discharge status: Home / Self Care | Payer: Medicaid Other | Attending: Emergency Medicine | Admitting: Emergency Medicine

## 2016-08-24 DIAGNOSIS — H9201 Otalgia, right ear: Secondary | ICD-10-CM | POA: Diagnosis present

## 2016-08-24 DIAGNOSIS — E119 Type 2 diabetes mellitus without complications: Secondary | ICD-10-CM | POA: Diagnosis not present

## 2016-08-24 DIAGNOSIS — F17200 Nicotine dependence, unspecified, uncomplicated: Secondary | ICD-10-CM | POA: Diagnosis not present

## 2016-08-24 DIAGNOSIS — Z79899 Other long term (current) drug therapy: Secondary | ICD-10-CM | POA: Diagnosis not present

## 2016-08-24 DIAGNOSIS — H66001 Acute suppurative otitis media without spontaneous rupture of ear drum, right ear: Secondary | ICD-10-CM

## 2016-08-24 DIAGNOSIS — H6981 Other specified disorders of Eustachian tube, right ear: Secondary | ICD-10-CM

## 2016-08-24 DIAGNOSIS — H6991 Unspecified Eustachian tube disorder, right ear: Secondary | ICD-10-CM | POA: Insufficient documentation

## 2016-08-24 MED ORDER — ACETAMINOPHEN-CODEINE #3 300-30 MG PO TABS: 1.0000 | ORAL_TABLET | ORAL | 0 refills | Status: DC | PRN

## 2016-08-24 MED ORDER — AMOXICILLIN-POT CLAVULANATE 875-125 MG PO TABS: 1.0000 | ORAL_TABLET | Freq: Two times a day (BID) | ORAL | 0 refills | Status: DC

## 2016-08-24 MED ORDER — AMOXICILLIN-POT CLAVULANATE 875-125 MG PO TABS
1.0000 | ORAL_TABLET | Freq: Once | ORAL | Status: AC
  Administered 2016-08-24: 1 via ORAL
  Filled 2016-08-24: qty 1

## 2016-08-24 MED ORDER — ACETAMINOPHEN-CODEINE #3 300-30 MG PO TABS
2.0000 | ORAL_TABLET | Freq: Once | ORAL | Status: AC
  Administered 2016-08-24: 2 via ORAL
  Filled 2016-08-24: qty 2

## 2016-08-24 MED ORDER — FLUTICASONE FUROATE 27.5 MCG/SPRAY NA SUSP: 2.0000 | Freq: Every day | NASAL | 0 refills | Status: DC

## 2016-08-24 NOTE — ED Provider Notes (Signed)
[facility] Emergency Department Provider Note  ____________________________________________  Time seen: Approximately 2:18 PM  I have reviewed the triage vital signs and the nursing notes.   HISTORY  Chief Complaint Otalgia   HPI Jacob Holmes is a 40 y.o. male who presents to the emergency department for evaluation of right ear ache. Symptoms started about one week ago. He states that he has decreased hearing from the right ear as well. He has taken Tylenol without relief. He denies fever.   Past Medical History:  Diagnosis Date  . Bipolar disorder (HCC)   . Cardiomegaly   . Diabetes mellitus without complication (HCC)   . Seizures [facility](HCC)     Patient Active Problem List   Diagnosis Date Noted  . Major depression 05/11/2015  . Opioid type dependence, continuous (HCC) 05/09/2015  . Severe recurrent major depression without psychotic features (HCC) 05/09/2015  . Polysubstance abuse 05/09/2015    Past Surgical History:  Procedure Laterality Date  . HERNIA REPAIR      Prior to Admission medications   Medication Sig Start Date End Date Taking? Authorizing Provider  acetaminophen-codeine (TYLENOL #3) 300-30 MG tablet Take 1-2 tablets by mouth every 4 (four) hours as needed for moderate pain. 08/24/16   Chinita Pesterari B Harrell Niehoff, FNP  amoxicillin-clavulanate (AUGMENTIN) 875-125 MG tablet Take 1 tablet by mouth 2 (two) times daily. 08/24/16   Chinita Pesterari B Lamir Racca, FNP  diazepam (VALIUM) 2 MG tablet Take 1 tablet (2 mg total) by mouth every 8 (eight) hours as needed. 10/19/15   Chinita Pesterari B Ramelo Oetken, FNP  divalproex (DEPAKOTE ER) 500 MG 24 hr tablet Take 2 tablets (1,000 mg total) by mouth at bedtime. 05/11/15   Adonis BrookSheila Agustin, NP  fluticasone (VERAMYST) 27.5 MCG/SPRAY nasal spray Place 2 sprays into the nose daily. 08/24/16   Chinita Pesterari B Matea Stanard, FNP  hydrOXYzine (ATARAX/VISTARIL) 25 MG tablet Take 1 tablet (25 mg total) by mouth every 6 (six) hours as needed for anxiety.  05/11/15   Adonis BrookSheila Agustin, NP  ketorolac (TORADOL) 10 MG tablet Take 1 tablet (10 mg total) by mouth every 6 (six) hours as needed. 10/19/15   Arval Brandstetter B Lavaeh Bau, FNP  sitaGLIPtin (JANUVIA) 50 MG tablet Take 50 mg by mouth daily.    Historical Provider, MD  traZODone (DESYREL) 100 MG tablet Take 1 tablet (100 mg total) by mouth at bedtime as needed for sleep. 05/11/15   Adonis BrookSheila Agustin, NP    Allergies Patient has no known allergies.  History reviewed. No pertinent family history.  Social History Social History  Substance Use Topics  . Smoking status: Current Every Day Smoker  . Smokeless tobacco: Not on file  . Alcohol use No    Review of Systems Constitutional: Negative fever/chills ENT: Negative for sore throat. Positive for right ear ache Cardiovascular: Denies chest pain. Respiratory: Negative shortness of breath. Negative for cough. Gastrointestinal: Negative for nausea,  no vomiting.  No diarrhea.  Musculoskeletal: Negative for body aches Skin: Negative for rash. Neurological: Negative for headaches ____________________________________________   PHYSICAL EXAM:  VITAL SIGNS: ED Triage Vitals  Enc Vitals Group     BP 08/24/16 1354 135/85     Pulse Rate 08/24/16 1354 83     Resp 08/24/16 1354 18     Temp 08/24/16 1354 97.9 F (36.6 C)     Temp Source 08/24/16 1354 Oral     SpO2 08/24/16 1354 97 %     Weight 08/24/16 1355 181 lb (82.1 kg)     Height  08/24/16 1355 5\' 6"  (1.676 m)     Head Circumference --      Peak Flow --      Pain Score 08/24/16 1356 6     Pain Loc --      Pain Edu? --      Excl. in GC? --     Constitutional: Alert and oriented. Acutely ill appearing and in no acute distress. Eyes: Conjunctivae are normal. EOMI. Ears: Right tympanic membrane erythematous and bulging. Appears intact. Left tympanic membrane within normal limits Nose: No congestion; no rhinnorhea. Mouth/Throat: Mucous membranes are moist.  Oropharynx normal. Tonsils appear  normal. Neck: No stridor.  Lymphatic: No cervical lymphadenopathy. Cardiovascular: Normal rate, regular rhythm. Grossly normal heart sounds.  Good peripheral circulation. Respiratory: Normal respiratory effort.  No retractions. Clear to auscultation. Musculoskeletal: FROM x 4 extremities.  Neurologic:  Normal speech and language.  Skin:  Skin is warm, dry and intact. No rash noted. Psychiatric: Mood and affect are normal. Speech and behavior are normal.  ____________________________________________   LABS (all labs ordered are listed, but only abnormal results are displayed)  Labs Reviewed - No data to display ____________________________________________  EKG   ____________________________________________  RADIOLOGY   ____________________________________________   PROCEDURES  Procedure(s) performed: None  Critical Care performed: No  ____________________________________________   INITIAL IMPRESSION / ASSESSMENT AND PLAN / ED COURSE  Clinical Course     Pertinent labs & imaging results that were available during my care of the patient were reviewed by me and considered in my medical decision making (see chart for details).   40 year old male with right otitis media. He will be prescribed Augmentin, Flonase, and Tylenol 3. He was advised to take ibuprofen in addition to the Tylenol 3 if the pain or fever continues. He was advised to follow-up with his primary care provider for symptoms that are not improving over the next 2-3 days. He was advised to return to the emergency department for symptoms that change or worsen if he is unable schedule an appointment. ____________________________________________   FINAL CLINICAL IMPRESSION(S) / ED DIAGNOSES  Final diagnoses:  Acute suppurative otitis media of right ear without spontaneous rupture of tympanic membrane, recurrence not specified  Eustachian tube dysfunction, right    Note:  This document was prepared using  Dragon voice recognition software and may include unintentional dictation errors.     Chinita PesterCari B Kristine Tiley, FNP 08/24/16 1645    Jene Everyobert Kinner, MD 08/25/16 [phone]

## 2016-08-24 NOTE — ED Triage Notes (Signed)
States right sided ear pain for several days, states difficulty hearing, denies any drainage

## 2016-08-24 NOTE — Discharge Instructions (Signed)
Take ibuprofen in addition to the tylenol with codeine if needed for pain.
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
## 2019-06-24 DIAGNOSIS — F322 Major depressive disorder, single episode, severe without psychotic features: Secondary | ICD-10-CM | POA: Diagnosis not present

## 2019-07-05 DIAGNOSIS — F322 Major depressive disorder, single episode, severe without psychotic features: Secondary | ICD-10-CM | POA: Diagnosis not present

## 2019-07-08 DIAGNOSIS — F322 Major depressive disorder, single episode, severe without psychotic features: Secondary | ICD-10-CM | POA: Diagnosis not present

## 2019-08-08 DIAGNOSIS — F322 Major depressive disorder, single episode, severe without psychotic features: Secondary | ICD-10-CM | POA: Diagnosis not present

## 2019-08-10 ENCOUNTER — Encounter: Payer: Self-pay | Admitting: Emergency Medicine

## 2019-08-10 ENCOUNTER — Emergency Department
Admission: EM | Admit: 2019-08-10 | Discharge: 2019-08-11 | Disposition: A | Discharge status: Other Acute Inpatient Hospital | Payer: Medicaid Other | Attending: Emergency Medicine | Admitting: Emergency Medicine

## 2019-08-10 DIAGNOSIS — F329 Major depressive disorder, single episode, unspecified: Secondary | ICD-10-CM | POA: Diagnosis present

## 2019-08-10 DIAGNOSIS — F17210 Nicotine dependence, cigarettes, uncomplicated: Secondary | ICD-10-CM | POA: Diagnosis not present

## 2019-08-10 DIAGNOSIS — R45851 Suicidal ideations: Secondary | ICD-10-CM | POA: Diagnosis not present

## 2019-08-10 DIAGNOSIS — F1920 Other psychoactive substance dependence, uncomplicated: Secondary | ICD-10-CM | POA: Diagnosis not present

## 2019-08-10 DIAGNOSIS — Z79899 Other long term (current) drug therapy: Secondary | ICD-10-CM | POA: Diagnosis not present

## 2019-08-10 DIAGNOSIS — E119 Type 2 diabetes mellitus without complications: Secondary | ICD-10-CM | POA: Diagnosis not present

## 2019-08-10 DIAGNOSIS — Z20828 Contact with and (suspected) exposure to other viral communicable diseases: Secondary | ICD-10-CM | POA: Insufficient documentation

## 2019-08-10 DIAGNOSIS — F332 Major depressive disorder, recurrent severe without psychotic features: Secondary | ICD-10-CM | POA: Diagnosis present

## 2019-08-10 DIAGNOSIS — Z03818 Encounter for observation for suspected exposure to other biological agents ruled out: Secondary | ICD-10-CM | POA: Diagnosis not present

## 2019-08-10 DIAGNOSIS — F1120 Opioid dependence, uncomplicated: Secondary | ICD-10-CM | POA: Diagnosis present

## 2019-08-10 DIAGNOSIS — F1910 Other psychoactive substance abuse, uncomplicated: Secondary | ICD-10-CM | POA: Diagnosis present

## 2019-08-10 LAB — URINE DRUG SCREEN, QUALITATIVE (ARMC ONLY)
Amphetamines, Ur Screen: POSITIVE — AB
Barbiturates, Ur Screen: NOT DETECTED
Benzodiazepine, Ur Scrn: NOT DETECTED
Cannabinoid 50 Ng, Ur ~~LOC~~: POSITIVE — AB
Cocaine Metabolite,Ur ~~LOC~~: NOT DETECTED
MDMA (Ecstasy)Ur Screen: NOT DETECTED
Methadone Scn, Ur: NOT DETECTED
Opiate, Ur Screen: NOT DETECTED
Phencyclidine (PCP) Ur S: NOT DETECTED
Tricyclic, Ur Screen: NOT DETECTED

## 2019-08-10 LAB — CBC
HCT: 45.4 % (ref 39.0–52.0)
Hemoglobin: 16.1 g/dL (ref 13.0–17.0)
MCH: 31.1 pg (ref 26.0–34.0)
MCHC: 35.5 g/dL (ref 30.0–36.0)
MCV: 87.6 fL (ref 80.0–100.0)
Platelets: 185 10*3/uL (ref 150–400)
RBC: 5.18 MIL/uL (ref 4.22–5.81)
RDW: 11.9 % (ref 11.5–15.5)
WBC: 14.9 10*3/uL — ABNORMAL HIGH (ref 4.0–10.5)
nRBC: 0 % (ref 0.0–0.2)

## 2019-08-10 LAB — COMPREHENSIVE METABOLIC PANEL
ALT: 17 U/L (ref 0–44)
AST: 13 U/L — ABNORMAL LOW (ref 15–41)
Albumin: 4 g/dL (ref 3.5–5.0)
Alkaline Phosphatase: 46 U/L (ref 38–126)
Anion gap: 9 (ref 5–15)
BUN: 18 mg/dL (ref 6–20)
CO2: 25 mmol/L (ref 22–32)
Calcium: 9.2 mg/dL (ref 8.9–10.3)
Chloride: 104 mmol/L (ref 98–111)
Creatinine, Ser: 0.86 mg/dL (ref 0.61–1.24)
GFR calc Af Amer: >60 mL/min (ref >60)
GFR calc non Af Amer: >60 mL/min (ref >60)
Glucose, Bld: 194 mg/dL — ABNORMAL HIGH (ref 70–99)
Potassium: 4.2 mmol/L (ref 3.5–5.1)
Sodium: 138 mmol/L (ref 135–145)
Total Bilirubin: 0.5 mg/dL (ref 0.3–1.2)
Total Protein: 6.8 g/dL (ref 6.5–8.1)

## 2019-08-10 LAB — SALICYLATE LEVEL: Salicylate Lvl: <7 mg/dL (ref 2.8–30.0)

## 2019-08-10 LAB — ACETAMINOPHEN LEVEL: Acetaminophen (Tylenol), Serum: <10 ug/mL — ABNORMAL LOW (ref 10–30)

## 2019-08-10 LAB — ETHANOL: Alcohol, Ethyl (B): <10 mg/dL (ref <10)

## 2019-08-10 NOTE — ED Notes (Signed)
Pt. States feeling depressed with SI thoughts.  Pt. States recently going to [place] and setting up appointment to talk to therapist.  Pt. States he has been compliant with medications.

## 2019-08-10 NOTE — Consult Note (Addendum)
[facility] Psychiatry Consult   Reason for Consult:  Suicidal Referring Physician: Dr. Joni Fears Patient Identification: Jacob Holmes MRN:  [id number] Principal Diagnosis: Severe recurrent major depression without psychotic features [facility]) Diagnosis:  Principal Problem:   Severe recurrent major depression without psychotic features ([place]) Active Problems:   Opioid type dependence, continuous ([place])   Polysubstance abuse ([place])   Major depression   Total Time spent with patient: 45 minutes  Subjective: " If I can't get the help I need. I will do it." Jacob Holmes is a 43 y.o. male patient presented to [facility] ED via POV and voluntarily. The patient was placed under involuntary commitment status (IVC) per the EDP.  Per the ED triage nursing note, the patient was brought in due to him having thoughts of hurting himself.  The patient disclosed his girlfriend died in 01-16-2019 and has been experiencing severe depression since her death.  He informed going to [place] and setting up an appointment to see a therapist.  The patient disclosed having a history of bipolar disorder and manic depression. He voiced, " if things keep going the way it has been, I will do something."  The patient disclosed that he currently lives with his brother, and he is on disability.  The patient continues to remind the psychiatry team that he will go home and "end it all if he is not admitted."  He states, "I need to be admitted. Because if I am not. I will do it and be successful." The patient was seen face-to-face by this provider; chart reviewed and consulted with Dr. Alm Bustard on 08/10/2019 due to the patient's care. It was discussed with the EDP that the patient does meet criteria to be admitted to the psychiatric inpatient unit.  The patient is alert and oriented x 4, calm, cooperative, and mood-congruent with affect on evaluation. The patient does not appear to be responding to internal or external stimuli.  Neither is the patient presenting with any delusional thinking. The patient denies auditory or visual hallucinations. The patient admits to suicidal ideation but denies homicidal and self-harm ideations. The patient is not presenting with any psychotic or paranoid behaviors. During an encounter with the patient, he was able to answer questions appropriately.  Plan: The patient is a safety risk to self and does require psychiatric inpatient admission for stabilization and treatment.  HPI: Per Dr. Alm Bustard: Jacob Holmes is a 43 y.o. male with a history of bipolar disorder, diabetes, seizures who reports feeling depressed since his girlfriend died in Jan 16, 2019.  Over the past few weeks has been having worsening suicidal thoughts, states "I do not want to be here anymore," and states he has an intention to kill himself and is worried about his own safety if he is at home.  No specific plan currently no weapons in his home.  Denies drug or alcohol use.  He reports that he has been taking all his usual medications without changing or running out except that he did run out of his Lamictal and Depakote yesterday.  Denies any recent illness fever body aches chest pain shortness of breath cough or vomiting.   Past Psychiatric History:  Bipolar disorder ([place])  Risk to Self: Suicidal Ideation: Yes-Currently Present Suicidal Intent: Yes-Currently Present Is patient at risk for suicide?: Yes Suicidal Plan?: Yes-Currently Present Specify Current Suicidal Plan: Pt reports going to sleep and never waking up Access to Means: No What has been your use of drugs/alcohol within the last  12 months?: None reported How many times?: 0 Other Self Harm Risks: Cutting Triggers for Past Attempts: Spouse contact Intentional Self Injurious Behavior: Cutting Comment - Self Injurious Behavior: Not specified Risk to Others: Homicidal Ideation: No Thoughts of Harm to Others: No Current Homicidal Intent: No Current  Homicidal Plan: No Access to Homicidal Means: No Identified Victim: None reported History of harm to others?: No Assessment of Violence: None Noted Violent Behavior Description: None reported Does patient have access to weapons?: No Criminal Charges Pending?: No Does patient have a court date: No Prior Inpatient Therapy: Prior Inpatient Therapy: Yes Prior Therapy Dates: 2014; 2016 Prior Therapy Facilty/Provider(s): [facility]; 2016. [facility] 2014 Reason for Treatment: Depression Prior Outpatient Therapy: Prior Outpatient Therapy: Yes Prior Therapy Dates: Current Prior Therapy Facilty/Provider(s): [facility] Reason for Treatment: Depression Does patient have an ACCT team?: No Does patient have Intensive In-House Services?  : No Does patient have [facility] services? : No Does patient have P4CC services?: No  Past Medical History:  Past Medical History:  Diagnosis Date  . Bipolar disorder (HCC)   . Cardiomegaly   . Diabetes mellitus without complication (HCC)   . Seizures (HCC)     Past Surgical History:  Procedure Laterality Date  . HERNIA REPAIR     Family History: History reviewed. No pertinent family history. Family Psychiatric  History: Yes, but unknown diagnosis Social History:  Social History   Substance and Sexual Activity  Alcohol Use No     Social History   Substance and Sexual Activity  Drug Use Not on file    Social History   Socioeconomic History  . Marital status: Single    Spouse name: Not on file  . Number of children: Not on file  . Years of education: Not on file  . Highest education level: Not on file  Occupational History  . Not on file  Social Needs  . Financial resource strain: Not on file  . Food insecurity    Worry: Not on file    Inability: Not on file  . Transportation needs    Medical: Not on file    Non-medical: Not on file  Tobacco Use  . Smoking status: Current Every Day Smoker    Packs/day: 2.00    Years: 25.00    Pack years: 50.00     Types: Cigarettes  . Smokeless tobacco: Never Used  Substance and Sexual Activity  . Alcohol use: No  . Drug use: Not on file  . Sexual activity: Not on file  Lifestyle  . Physical activity    Days per week: Not on file    Minutes per session: Not on file  . Stress: Not on file  Relationships  . Social Musician on phone: Not on file    Gets together: Not on file    Attends religious service: Not on file    Active member of club or organization: Not on file    Attends meetings of clubs or organizations: Not on file    Relationship status: Not on file  Other Topics Concern  . Not on file  Social History Narrative  . Not on file   Additional Social History:    Allergies:  No Known Allergies  Labs:  Results for orders placed or performed during the hospital encounter of 08/10/19 (from the past 48 hour(s))  Comprehensive metabolic panel     Status: Abnormal   Collection Time: 08/10/19  7:40 PM  Result Value Ref Range  Sodium 138 135 - 145 mmol/L   Potassium 4.2 3.5 - 5.1 mmol/L   Chloride 104 98 - 111 mmol/L   CO2 25 22 - 32 mmol/L   Glucose, Bld 194 (H) 70 - 99 mg/dL   BUN 18 6 - 20 mg/dL   Creatinine, Ser 1.61 0.61 - 1.24 mg/dL   Calcium 9.2 8.9 - 09.6 mg/dL   Total Protein 6.8 6.5 - 8.1 g/dL   Albumin 4.0 3.5 - 5.0 g/dL   AST 13 (L) 15 - 41 U/L   ALT 17 0 - 44 U/L   Alkaline Phosphatase 46 38 - 126 U/L   Total Bilirubin 0.5 0.3 - 1.2 mg/dL   GFR calc non Af Amer >60 >60 mL/min   GFR calc Af Amer >60 >60 mL/min   Anion gap 9 5 - 15    Comment: Performed at [facility], [address] [place], [place] [address]  Ethanol     Status: None   Collection Time: 08/10/19  7:40 PM  Result Value Ref Range   Alcohol, Ethyl (B) <10 <10 mg/dL    Comment: (NOTE) Lowest detectable limit for serum alcohol is 10 mg/dL. For medical purposes only. Performed at [facility], [address] [place], [place] [address]   Salicylate level      Status: None   Collection Time: 08/10/19  7:40 PM  Result Value Ref Range   Salicylate Lvl <7.0 2.8 - 30.0 mg/dL    Comment: Performed at [facility], [address] [place], [place] [address]  Acetaminophen level     Status: Abnormal   Collection Time: 08/10/19  7:40 PM  Result Value Ref Range   Acetaminophen (Tylenol), Serum <10 (L) 10 - 30 ug/mL    Comment: (NOTE) Therapeutic concentrations vary significantly. A range of 10-30 ug/mL  may be an effective concentration for many patients. However, some  are best treated at concentrations outside of this range. Acetaminophen concentrations >150 ug/mL at 4 hours after ingestion  and >50 ug/mL at 12 hours after ingestion are often associated with  toxic reactions. Performed at [facility], [address] [place], [place] [address]   cbc     Status: Abnormal   Collection Time: 08/10/19  7:40 PM  Result Value Ref Range   WBC 14.9 (H) 4.0 - 10.5 K/uL   RBC 5.18 4.22 - 5.81 MIL/uL   Hemoglobin 16.1 13.0 - 17.0 g/dL   HCT 30.8 65.7 - 84.6 %   MCV 87.6 80.0 - 100.0 fL   MCH 31.1 26.0 - 34.0 pg   MCHC 35.5 30.0 - 36.0 g/dL   RDW 96.2 95.2 - 84.1 %   Platelets 185 150 - 400 K/uL   nRBC 0.0 0.0 - 0.2 %    Comment: Performed at [facility], [address] [place], [place] [address]  Urine Drug Screen, Qualitative     Status: Abnormal   Collection Time: 08/10/19  7:40 PM  Result Value Ref Range   Tricyclic, Ur Screen NONE DETECTED NONE DETECTED   Amphetamines, Ur Screen POSITIVE (A) NONE DETECTED   MDMA (Ecstasy)Ur Screen NONE DETECTED NONE DETECTED   Cocaine Metabolite,Ur Needles NONE DETECTED NONE DETECTED   Opiate, Ur Screen NONE DETECTED NONE DETECTED   Phencyclidine (PCP) Ur S NONE DETECTED NONE DETECTED   Cannabinoid 50 Ng, Ur [place] POSITIVE (A) NONE DETECTED   Barbiturates, Ur Screen NONE DETECTED NONE DETECTED   Benzodiazepine, Ur Scrn NONE DETECTED NONE DETECTED  Methadone Scn, Ur NONE DETECTED  NONE DETECTED    Comment: (NOTE) Tricyclics + metabolites, urine    Cutoff 1000 ng/mL Amphetamines + metabolites, urine  Cutoff 1000 ng/mL MDMA (Ecstasy), urine              Cutoff 500 ng/mL Cocaine Metabolite, urine          Cutoff 300 ng/mL Opiate + metabolites, urine        Cutoff 300 ng/mL Phencyclidine (PCP), urine         Cutoff 25 ng/mL Cannabinoid, urine                 Cutoff 50 ng/mL Barbiturates + metabolites, urine  Cutoff 200 ng/mL Benzodiazepine, urine              Cutoff 200 ng/mL Methadone, urine                   Cutoff 300 ng/mL The urine drug screen provides only a preliminary, unconfirmed analytical test result and should not be used for non-medical purposes. Clinical consideration and professional judgment should be applied to any positive drug screen result due to possible interfering substances. A more specific alternate chemical method must be used in order to obtain a confirmed analytical result. Gas chromatography / mass spectrometry (GC/MS) is the preferred confirmat ory method. Performed at [facility], [address] [place], [place] [address]     No current facility-administered medications for this encounter.    Current Outpatient Medications  Medication Sig Dispense Refill  . divalproex (DEPAKOTE) 500 MG DR tablet Take 1,000 mg by mouth at bedtime.    . lamoTRIgine (LAMICTAL) 150 MG tablet Take 150 mg by mouth daily.    . vitamin B-12 (CYANOCOBALAMIN) 1000 MCG tablet Take 1,000 mcg by mouth daily.      Musculoskeletal: Strength & Muscle Tone: within normal limits Gait & Station: normal Patient leans: N/A  Psychiatric Specialty Exam: Physical Exam  Nursing note and vitals reviewed. Constitutional: He is oriented to person, place, and time. He appears well-developed.  Neck: Normal range of motion. Neck supple.  Respiratory: Effort normal.  Musculoskeletal: Normal range of motion.  Neurological: He is alert and oriented to  person, place, and time.    Review of Systems  Psychiatric/Behavioral: Positive for depression, substance abuse and suicidal ideas. The patient is nervous/anxious and has insomnia.   All other systems reviewed and are negative.   Blood pressure 137/81, pulse (!) 102, temperature 98 F (36.7 C), temperature source Oral, resp. rate 18, height 5\' 6"  (1.676 m), weight 74.4 kg, SpO2 93 %.Body mass index is 26.47 kg/m.  General Appearance: Casual  Eye Contact:  Fair  Speech:  Clear and Coherent  Volume:  Decreased  Mood:  Anxious, Depressed and Hopeless  Affect:  Congruent  Thought Process:  Coherent  Orientation:  Full (Time, Place, and Person)  Thought Content:  WDL and Logical  Suicidal Thoughts:  Yes.  without intent/plan  Homicidal Thoughts:  No  Memory:  Immediate;   Good Recent;   Good Remote;   Good  Judgement:  Poor  Insight:  Lacking  Psychomotor Activity:  Normal  Concentration:  Concentration: Good and Attention Span: Good  Recall:  Good  Fund of Knowledge:  Good  Language:  Good  Akathisia:  Negative  Handed:  Right  AIMS (if indicated):     Assets:  Desire for Improvement Social Support  ADL's:  Intact  Cognition:  WNL  Sleep:  Insomnia     Treatment Plan Summary: Medication management and Plan Patient meets criteria for psychiatric inpatient admission.  Disposition: Recommend psychiatric Inpatient admission when medically cleared. Supportive therapy provided about ongoing stressors.  Gillermo Murdoch, NP 08/10/2019 10:26 PM

## 2019-08-10 NOTE — ED Notes (Signed)
Pt changed into burgundy scrubs and belongings placed in bag to include tshirt, jeans, shorts, mask, cross necklace, belt, wallet in back pocket of jeans, keys in front pocket and hat.

## 2019-08-10 NOTE — BH Assessment (Signed)
Assessment Note  Jacob Holmes is an 43 y.o. male that came to the Emergency department reporting depression symptoms. Pt reports that he has been battling with depression and reports an increase of symptoms due to his girlfriend passing away in April of this year. Pt reports that he is currently living with his brother and has just started seeing a psychiatrist at [facility] to assist with his depression. Pt reports difficulty with sleep and reports current SI with a plan. Pt reports that he will go to sleep and never wake up.   Diagnosis: Major depressive disorder  Past Medical History:  Past Medical History:  Diagnosis Date  . Bipolar disorder (HCC)   . Cardiomegaly   . Diabetes mellitus without complication (HCC)   . Seizures (HCC)     Past Surgical History:  Procedure Laterality Date  . HERNIA REPAIR      Family History: History reviewed. No pertinent family history.  Social History:  reports that he has been smoking cigarettes. He has a 50.00 pack-year smoking history. He has never used smokeless tobacco. He reports that he does not drink alcohol. No history on file for drug.  Additional Social History:  Alcohol / Drug Use Pain Medications: See MAR Prescriptions: See MAR Over the Counter: See MAR History of alcohol / drug use?: No history of alcohol / drug abuse(Pt denied, drug screen was positive for marijuana and amphetamines)  CIWA: CIWA-Ar BP: 137/81 Pulse Rate: (!) 102 COWS:    Allergies: No Known Allergies  Home Medications: (Not in a hospital admission)   OB/GYN Status:  No LMP for male patient.  General Assessment Data Location of Assessment: [facility] ED TTS Assessment: In system Is this a Tele or Face-to-Face Assessment?: Face-to-Face Is this an Initial Assessment or a Re-assessment for this encounter?: Initial Assessment Patient Accompanied by:: N/A Language Other than English: No Living Arrangements: Other (Comment)(Pt reports living with brother) What  gender do you identify as?: Male Marital status: Single Living Arrangements: Other relatives Can pt return to current living arrangement?: Yes Admission Status: Involuntary Petitioner: ED Attending Is patient capable of signing voluntary admission?: No Referral Source: Self/Family/Friend Insurance type: Medicaid  Medical Screening Exam [facility] Walk-in ONLY) Medical Exam completed: Yes  Crisis Care Plan Living Arrangements: Other relatives Legal Guardian: (Self) Name of Psychiatrist: [facility] Name of Therapist: None reported  Education Status Is patient currently in school?: No Is the patient employed, unemployed or receiving disability?: Receiving disability income  Risk to self with the past 6 months Suicidal Ideation: Yes-Currently Present Has patient been a risk to self within the past 6 months prior to admission? : Yes Suicidal Intent: Yes-Currently Present Has patient had any suicidal intent within the past 6 months prior to admission? : Yes Is patient at risk for suicide?: Yes Suicidal Plan?: Yes-Currently Present Has patient had any suicidal plan within the past 6 months prior to admission? : Yes Specify Current Suicidal Plan: Pt reports going to sleep and never waking up Access to Means: No What has been your use of drugs/alcohol within the last 12 months?: None reported Previous Attempts/Gestures: Yes How many times?: 0 Other Self Harm Risks: Cutting Triggers for Past Attempts: Spouse contact Intentional Self Injurious Behavior: Cutting Comment - Self Injurious Behavior: Not specified Family Suicide History: Unknown Recent stressful life event(s): Conflict (Comment), Loss (Comment)(Family conflict, lost his home) Persecutory voices/beliefs?: No Depression: Yes Depression Symptoms: Isolating, Feeling worthless/self pity Substance abuse history and/or treatment for substance abuse?: No Suicide prevention information given  to non-admitted patients: Not applicable  Risk  to Others within the past 6 months Homicidal Ideation: No Does patient have any lifetime risk of violence toward others beyond the six months prior to admission? : No Thoughts of Harm to Others: No Current Homicidal Intent: No Current Homicidal Plan: No Access to Homicidal Means: No Identified Victim: None reported History of harm to others?: No Assessment of Violence: None Noted Violent Behavior Description: None reported Does patient have access to weapons?: No Criminal Charges Pending?: No Does patient have a court date: No Is patient on probation?: No  Psychosis Hallucinations: None noted Delusions: None noted  Mental Status Report Appearance/Hygiene: In scrubs, Disheveled Eye Contact: Fair Motor Activity: Freedom of movement Speech: Logical/coherent, Soft Level of Consciousness: Alert Mood: Depressed, Anxious Affect: Depressed, Sad, Flat Anxiety Level: Moderate Thought Processes: Coherent Judgement: Unimpaired Orientation: Person, Place, Time, Situation, Appropriate for developmental age Obsessive Compulsive Thoughts/Behaviors: None  Cognitive Functioning Concentration: Normal Memory: Recent Intact, Remote Intact Is patient IDD: No Insight: Good Impulse Control: Good Appetite: Fair Have you had any weight changes? : No Change Sleep: Decreased Total Hours of Sleep: 5(Difficulty staying asleep) Vegetative Symptoms: None  ADLScreening [facility] Assessment Services) Patient's cognitive ability adequate to safely complete daily activities?: Yes Patient able to express need for assistance with ADLs?: Yes Independently performs ADLs?: Yes (appropriate for developmental age)  Prior Inpatient Therapy Prior Inpatient Therapy: Yes Prior Therapy Dates: 2014; 2016 Prior Therapy Facilty/Provider(s): [facility]; 2016. [place] 2014 Reason for Treatment: Depression  Prior Outpatient Therapy Prior Outpatient Therapy: Yes Prior Therapy Dates: Current Prior Therapy  Facilty/Provider(s): [facility] Reason for Treatment: Depression Does patient have an ACCT team?: No Does patient have Intensive In-House Services?  : No Does patient have [facility] services? : No Does patient have P4CC services?: No  ADL Screening (condition at time of admission) Patient's cognitive ability adequate to safely complete daily activities?: Yes Patient able to express need for assistance with ADLs?: Yes Independently performs ADLs?: Yes (appropriate for developmental age)       Abuse/Neglect Assessment (Assessment to be complete while patient is alone) Abuse/Neglect Assessment Can Be Completed: Yes Physical Abuse: Denies Verbal Abuse: Denies Sexual Abuse: Denies Exploitation of patient/patient's resources: Denies Values / Beliefs Cultural Requests During Hospitalization: None Spiritual Requests During Hospitalization: None Consults Spiritual Care Consult Needed: No Social Work Consult Needed: No Regulatory affairs officer (For Healthcare) Does Patient Have a Medical Advance Directive?: No       Child/Adolescent Assessment Running Away Risk: (Pt is an adult)  Disposition:  Disposition Initial Assessment Completed for this Encounter: Yes Disposition of Patient: Admit Type of inpatient treatment program: Adult Patient refused recommended treatment: No Mode of transportation if patient is discharged/movement?: N/A Patient referred to: Other (Comment)([facility])  On Site Evaluation by:   Reviewed with Physician:    Leonie Douglas 08/10/2019 10:23 PM

## 2019-08-10 NOTE — BH Assessment (Signed)
Patient is to be admitted to [facility] by Caroline Sauger, NP. Attending Physician will be Dr. Weber Cooks.   Patient has been assigned to room 315, by [facility] Charge Nurse [place].   Intake Paper Work has been signed and placed on patient chart.   ER staff is aware of the admission:  Juliann Pulse, ER Secretary    Dr. Joni Fears, ER MD   Catalina Antigua, Patient's Nurse   Sharyn Lull, Patient Access.

## 2019-08-10 NOTE — ED Provider Notes (Signed)
[facility] Emergency Department Provider Note  ____________________________________________  Time seen: Approximately 9:36 PM  I have reviewed the triage vital signs and the nursing notes.   HISTORY  Chief Complaint Suicidal    HPI Jacob Holmes is a 43 y.o. male with a history of bipolar disorder, diabetes, seizures who reports feeling depressed since his girlfriend died in 2019/01/26.  Over the past few weeks has been having worsening suicidal thoughts, states "I do not want to be here anymore," and states he has an intention to kill himself and is worried about his own safety if he is at home.  No specific plan currently no weapons in his home.  Denies drug or alcohol use.  He reports that he has been taking all his usual medications without changing or running out except that he did run out of his Lamictal and Depakote yesterday.  Denies any recent illness fever body aches chest pain shortness of breath cough or vomiting.    Past Medical History:  Diagnosis Date  . Bipolar disorder (HCC)   . Cardiomegaly   . Diabetes mellitus without complication (HCC)   . Seizures [facility])      Patient Active Problem List   Diagnosis Date Noted  . Major depression 05/11/2015  . Opioid type dependence, continuous (HCC) 05/09/2015  . Severe recurrent major depression without psychotic features (HCC) 05/09/2015  . Polysubstance abuse (HCC) 05/09/2015     Past Surgical History:  Procedure Laterality Date  . HERNIA REPAIR       Prior to Admission medications   Medication Sig Start Date End Date Taking? Authorizing Provider  divalproex (DEPAKOTE) 500 MG DR tablet Take 1,000 mg by mouth at bedtime. 06/27/19  Yes [provider]  lamoTRIgine (LAMICTAL) 150 MG tablet Take 150 mg by mouth daily. 06/29/19  Yes [provider]  vitamin B-12 (CYANOCOBALAMIN) 1000 MCG tablet Take 1,000 mcg by mouth daily.   Yes [provider]      Allergies Patient has no known allergies.   History reviewed. No pertinent family history.  Social History Social History   Tobacco Use  . Smoking status: Current Every Day Smoker    Packs/day: 2.00    Years: 25.00    Pack years: 50.00    Types: Cigarettes  . Smokeless tobacco: Never Used  Substance Use Topics  . Alcohol use: No  . Drug use: Not on file    Review of Systems  Constitutional:   No fever or chills.  ENT:   No sore throat. No rhinorrhea. Cardiovascular:   No chest pain or syncope. Respiratory:   No dyspnea or cough. Gastrointestinal:   Negative for abdominal pain, vomiting and diarrhea.  Musculoskeletal:   Negative for focal pain or swelling All other systems reviewed and are negative except as documented above in ROS and HPI.  ____________________________________________   PHYSICAL EXAM:  VITAL SIGNS: ED Triage Vitals  Enc Vitals Group     BP 08/10/19 1838 137/81     Pulse Rate 08/10/19 1838 (!) 102     Resp 08/10/19 1838 18     Temp 08/10/19 1838 98 F (36.7 C)     Temp Source 08/10/19 1838 Oral     SpO2 08/10/19 1838 93 %     Weight 08/10/19 1841 164 lb (74.4 kg)     Height 08/10/19 1841 5\' 6"  (1.676 m)     Head Circumference --      Peak Flow --  Pain Score 08/10/19 1841 0     Pain Loc --      Pain Edu? --      Excl. in [place]? --     Vital signs reviewed, nursing assessments reviewed.   Constitutional:   Alert and oriented. Non-toxic appearance. Eyes:   Conjunctivae are normal. EOMI. PERRL. ENT      Head:   Normocephalic and atraumatic.      Nose:   Wearing a mask.      Mouth/Throat:   Wearing a mask.      Neck:   No meningismus. Full ROM. Hematological/Lymphatic/Immunilogical:   No cervical lymphadenopathy. Cardiovascular:   RRR. Symmetric bilateral radial and DP pulses.  No murmurs. Cap refill less than 2 seconds. Respiratory:   Normal respiratory effort without tachypnea/retractions. Breath sounds are clear and equal  bilaterally. No wheezes/rales/rhonchi. Gastrointestinal:   Soft and nontender. Non distended. There is no CVA tenderness.  No rebound, rigidity, or guarding.  Musculoskeletal:   Normal range of motion in all extremities. No joint effusions.  No lower extremity tenderness.  No edema.  No wounds Neurologic:   Normal speech and language.  Motor grossly intact. No acute focal neurologic deficits are appreciated.  Skin:    Skin is warm, dry and intact. No rash noted.  No petechiae, purpura, or bullae.  ____________________________________________    LABS (pertinent positives/negatives) (all labs ordered are listed, but only abnormal results are displayed) Labs Reviewed  COMPREHENSIVE METABOLIC PANEL - Abnormal; Notable for the following components:      Result Value   Glucose, Bld 194 (*)    AST 13 (*)    All other components within normal limits  ACETAMINOPHEN LEVEL - Abnormal; Notable for the following components:   Acetaminophen (Tylenol), Serum <10 (*)    All other components within normal limits  CBC - Abnormal; Notable for the following components:   WBC 14.9 (*)    All other components within normal limits  URINE DRUG SCREEN, QUALITATIVE ([facility] ONLY) - Abnormal; Notable for the following components:   Amphetamines, Ur Screen POSITIVE (*)    Cannabinoid 50 Ng, Ur [place] POSITIVE (*)    All other components within normal limits  SARS CORONAVIRUS 2 (TAT 6-24 HRS)  ETHANOL  SALICYLATE LEVEL   ____________________________________________   EKG    ____________________________________________    RADIOLOGY  No results found.  ____________________________________________   PROCEDURES Procedures  ____________________________________________    CLINICAL IMPRESSION / ASSESSMENT AND PLAN / ED COURSE  Medications ordered in the ED: Medications - No data to display  Pertinent labs & imaging results that were available during my care of the patient were reviewed by me and  considered in my medical decision making (see chart for details).  Jacob Holmes was evaluated in Emergency Department on 08/10/2019 for the symptoms described in the history of present illness. He was evaluated in the context of the global COVID-19 pandemic, which necessitated consideration that the patient might be at risk for infection with the SARS-CoV-2 virus that causes COVID-19. Institutional protocols and algorithms that pertain to the evaluation of patients at risk for COVID-19 are in a state of rapid change based on information released by regulatory bodies including the CDC and federal and state organizations. These policies and algorithms were followed during the patient's care in the ED.   Patient presents with suicidal ideation, complicated grief from death of girlfriend 7 months ago.  With his self-reported safety concerns and intent to kill himself, initiated involuntary  commitment pending psychiatry evaluation.  I discussed with the psychiatrist after their assessment and they advised that they would plan to admit him for further psychiatric stabilization.  I will order Covid screening.  He is medically stable to proceed with psychiatry care.      ____________________________________________   FINAL CLINICAL IMPRESSION(S) / ED DIAGNOSES    Final diagnoses:  Suicidal ideations  Polysubstance abuse [facility](HCC)     ED Discharge Orders    None      Portions of this note were generated with dragon dictation software. Dictation errors may occur despite best attempts at proofreading.   Sharman CheekStafford, Offie Pickron, MD 08/10/19 2138

## 2019-08-10 NOTE — ED Triage Notes (Signed)
Pt to ED voluntarily with thoughts of hurting himself. Pt states his girlfriend died in Jan 22, 2023 and he has been on a "roller coaster ride ever since": Pt has hx of bi-polar disorder and manic depression. Pt states he doesn't have a plan but if things keep going as they are he is definitely going to do something.

## 2019-08-10 NOTE — ED Notes (Signed)
Patients girlfriend(Crystal) called [phone], wanted to know status of patient.  Patient had told me to tell her he was ok and would call back in morning.

## 2019-08-10 NOTE — ED Notes (Signed)
NP and TTS talking to patient in room #21.

## 2019-08-11 ENCOUNTER — Inpatient Hospital Stay
Admit: 2019-08-11 | Discharge: 2019-08-12 | DRG: 885 | Disposition: A | Discharge status: Home / Self Care | Payer: Medicaid Other | Source: Intra-hospital | Attending: Psychiatry | Admitting: Psychiatry

## 2019-08-11 ENCOUNTER — Other Ambulatory Visit: Payer: Self-pay

## 2019-08-11 DIAGNOSIS — R45851 Suicidal ideations: Secondary | ICD-10-CM | POA: Diagnosis not present

## 2019-08-11 DIAGNOSIS — F332 Major depressive disorder, recurrent severe without psychotic features: Secondary | ICD-10-CM

## 2019-08-11 DIAGNOSIS — F1290 Cannabis use, unspecified, uncomplicated: Secondary | ICD-10-CM | POA: Diagnosis present

## 2019-08-11 DIAGNOSIS — Z79899 Other long term (current) drug therapy: Secondary | ICD-10-CM

## 2019-08-11 DIAGNOSIS — Z8669 Personal history of other diseases of the nervous system and sense organs: Secondary | ICD-10-CM

## 2019-08-11 DIAGNOSIS — Z818 Family history of other mental and behavioral disorders: Secondary | ICD-10-CM | POA: Diagnosis not present

## 2019-08-11 DIAGNOSIS — G40A09 Absence epileptic syndrome, not intractable, without status epilepticus: Secondary | ICD-10-CM | POA: Diagnosis not present

## 2019-08-11 DIAGNOSIS — F322 Major depressive disorder, single episode, severe without psychotic features: Principal | ICD-10-CM | POA: Diagnosis present

## 2019-08-11 LAB — VALPROIC ACID LEVEL: Valproic Acid Lvl: <10 ug/mL — ABNORMAL LOW (ref 50.0–100.0)

## 2019-08-11 LAB — SARS CORONAVIRUS 2 BY RT PCR (HOSPITAL ORDER, PERFORMED IN ~~LOC~~ HOSPITAL LAB): SARS Coronavirus 2: NEGATIVE

## 2019-08-11 MED ORDER — LAMOTRIGINE 25 MG PO TABS
150.0000 mg | ORAL_TABLET | Freq: Every day | ORAL | Status: DC
  Administered 2019-08-11 – 2019-08-12 (×2): 150 mg via ORAL
  Filled 2019-08-11 (×2): qty 1

## 2019-08-11 MED ORDER — VITAMIN B-12 1000 MCG PO TABS
1000.0000 ug | ORAL_TABLET | Freq: Every day | ORAL | Status: DC
  Administered 2019-08-11 – 2019-08-12 (×2): 1000 ug via ORAL
  Filled 2019-08-11 (×2): qty 1

## 2019-08-11 MED ORDER — ESCITALOPRAM OXALATE 10 MG PO TABS
10.0000 mg | ORAL_TABLET | Freq: Every day | ORAL | Status: DC
  Administered 2019-08-11 – 2019-08-12 (×2): 10 mg via ORAL
  Filled 2019-08-11 (×2): qty 1

## 2019-08-11 MED ORDER — ACETAMINOPHEN 325 MG PO TABS: 650.0000 mg | ORAL_TABLET | Freq: Four times a day (QID) | ORAL | Status: DC | PRN

## 2019-08-11 MED ORDER — ALUM & MAG HYDROXIDE-SIMETH 200-200-20 MG/5ML PO SUSP: 30.0000 mL | ORAL | Status: DC | PRN

## 2019-08-11 MED ORDER — DIVALPROEX SODIUM 500 MG PO DR TAB
1000.0000 mg | DELAYED_RELEASE_TABLET | Freq: Every day | ORAL | Status: DC
  Administered 2019-08-11: 21:00:00 1000 mg via ORAL
  Filled 2019-08-11: qty 2

## 2019-08-11 MED ORDER — MAGNESIUM HYDROXIDE 400 MG/5ML PO SUSP: 30.0000 mL | Freq: Every day | ORAL | Status: DC | PRN

## 2019-08-11 NOTE — Progress Notes (Signed)
D: Patient stated slept good last night .Stated appetite is good and energy level  Is normal. Stated concentration is good . Stated on Depression scale 6 , hopeless 5 and anxiety 6 .( low 0-10 high) Denies suicidal  homicidal ideations  .  No auditory hallucinations  No pain concerns . Appropriate ADL'S. Interacting with peers and staff. Staff education patient on unit programing , continue to reinforce  patient  on the information received Emotional and mental  status  improved .  Voice of no safety concerns . No anger  frustration noted . Encourage healthy wake and sleep pattern. Able to verbalize understanding of medication received . Denies suicidal ideations . Encourage  interaction  with  peer and staff . Encourage to work on coping decisional making  and anxiety concerns . Controlling  Mood swings  Patient goal  A: Encourage patient participation with unit programming . Instruction  Given on  Medication , verbalize understanding. R: Voice no other concerns. Staff continue to monitor

## 2019-08-11 NOTE — H&P (Signed)
Psychiatric Admission Assessment Adult  Patient Identification: Jacob Holmes MRN:  [id number] Date of Evaluation:  08/11/2019 Chief Complaint:  SUICIDAL Principal Diagnosis: MDD (major depressive disorder), severe (HCC) Diagnosis:  Principal Problem:   MDD (major depressive disorder), severe (HCC) Active Problems:   History of seizure disorder  History of Present Illness: Patient seen and chart reviewed.  43 year old man came to the emergency room stating that he was extremely depressed and had suicidal thoughts.  Made dramatic statements about how if he could not get help he thought he would surely kill himself.  On admission and evaluation today the patient is no longer claiming suicidal thoughts.  He tells me that he said that because he was frustrated with [facility] and wanted to get started on medicine sooner rather than later.  He has been going to [facility] for a few weeks but because of their waiting list and the slowness of the process has not been able to see a doctor.  Patient says his mood is been sad and depressed since at least last April when his girlfriend died.  He stays sad and down most of the time.  He has frequent crying spells.  Energy and motivation is low.  He denies however having any suicidal thoughts.  He denies any psychotic symptoms.  He denies any problems with sleep or appetite.  He is currently taking Depakote and lamotrigine prescribed by his primary care doctor which she says are there for his history of seizure disorder.  Patient admits to using marijuana occasionally but says it is only about 1 time a week.  He denies that he has been using any other drugs and specifically denies routine amphetamine use even though I pointed out that his drug screen was positive for amphetamines.  His only excuse for this is that he used 1 Adderall "a month" ago. Associated Signs/Symptoms: Depression Symptoms:  depressed mood, anhedonia, psychomotor retardation, difficulty  concentrating, anxiety, (Hypo) Manic Symptoms:  Impulsivity, Anxiety Symptoms:  Excessive Worry, Psychotic Symptoms:  None reported PTSD Symptoms: Negative Total Time spent with patient: 1 hour  Past Psychiatric History: Patient has at least 2 prior hospitalizations 1 in 2014 and 2016.  Both of those times he was reporting depressed symptoms but also having substance abuse problems.  This time the patient claims he is not using narcotics and not abusing any substances.  He says his positive drug screen is only because of very rare marijuana use and 1 time distant Adderall use.  He has no history of suicide attempts or violence.  Although he says that he frequently has mood swings there is no history of mania.  Is the patient at risk to self? Yes.    Has the patient been a risk to self in the past 6 months? Yes.    Has the patient been a risk to self within the distant past? Yes.    Is the patient a risk to others? No.  Has the patient been a risk to others in the past 6 months? No.  Has the patient been a risk to others within the distant past? No.   Prior Inpatient Therapy:   Prior Outpatient Therapy:    Alcohol Screening: 1. How often do you have a drink containing alcohol?: Monthly or less 2. How many drinks containing alcohol do you have on a typical day when you are drinking?: 3 or 4 3. How often do you have six or more drinks on one occasion?: Less than monthly AUDIT-C Score:  3 4. How often during the last year have you found that you were not able to stop drinking once you had started?: Less than monthly 5. How often during the last year have you failed to do what was normally expected from you becasue of drinking?: Less than monthly 6. How often during the last year have you needed a first drink in the morning to get yourself going after a heavy drinking session?: Less than monthly 7. How often during the last year have you had a feeling of guilt of remorse after drinking?: Less  than monthly 8. How often during the last year have you been unable to remember what happened the night before because you had been drinking?: Less than monthly 9. Have you or someone else been injured as a result of your drinking?: No 10. Has a relative or friend or a doctor or another health worker been concerned about your drinking or suggested you cut down?: No Alcohol Use Disorder Identification Test Final Score (AUDIT): 8 Alcohol Brief Interventions/Follow-up: Alcohol Education Substance Abuse History in the last 12 months:  Yes.   Consequences of Substance Abuse: Medical Consequences:  Somewhat unclear because he is minimizing this Previous Psychotropic Medications: Yes  Psychological Evaluations: Yes  Past Medical History:  Past Medical History:  Diagnosis Date  . Bipolar disorder (HCC)   . Cardiomegaly   . Diabetes mellitus without complication (HCC)   . Seizures (HCC)     Past Surgical History:  Procedure Laterality Date  . HERNIA REPAIR     Family History: History reviewed. No pertinent family history. Family Psychiatric  History: He reports that both his mother and sister had depression.  He denies knowing of any suicide attempts of anyone in his family.  He also reports multiple people with alcohol abuse in his family Tobacco Screening: Have you used any form of tobacco in the last 30 days? (Cigarettes, Smokeless Tobacco, Cigars, and/or Pipes): Yes Tobacco use, Select all that apply: 5 or more cigarettes per day Are you interested in Tobacco Cessation Medications?: Yes, will notify MD for an order Counseled patient on smoking cessation including recognizing danger situations, developing coping skills and basic information about quitting provided: Yes Social History:  Social History   Substance and Sexual Activity  Alcohol Use No     Social History   Substance and Sexual Activity  Drug Use Not on file    Additional Social History: Marital status: Long term  relationship Long term relationship, how long?: 23 years What types of issues is patient dealing with in the relationship?: Pt reports that he was in a long-term relationship for 23 years before his girlfriend passed in April 2020. Additional relationship information: Pt reports that he now has a new girlfriend. Are you sexually active?: Yes What is your sexual orientation?: Heterosexual Has your sexual activity been affected by drugs, alcohol, medication, or emotional stress?: Pt reports "yes, it's been bothered some.  If I am not happy and in the mood it ain't going to happen no matter if my girlfriend wants it." Does patient have children?: Yes How many children?: 2 How is patient's relationship with their children?: Pt has no biological children, however, 2 step-children.  Relationship with step-children is not well.                         Allergies:  No Known Allergies Lab Results:  Results for orders placed or performed during the hospital encounter of 08/10/19 (  from the past 48 hour(s))  Comprehensive metabolic panel     Status: Abnormal   Collection Time: 08/10/19  7:40 PM  Result Value Ref Range   Sodium 138 135 - 145 mmol/L   Potassium 4.2 3.5 - 5.1 mmol/L   Chloride 104 98 - 111 mmol/L   CO2 25 22 - 32 mmol/L   Glucose, Bld 194 (H) 70 - 99 mg/dL   BUN 18 6 - 20 mg/dL   Creatinine, Ser 9.60 0.61 - 1.24 mg/dL   Calcium 9.2 8.9 - 45.4 mg/dL   Total Protein 6.8 6.5 - 8.1 g/dL   Albumin 4.0 3.5 - 5.0 g/dL   AST 13 (L) 15 - 41 U/L   ALT 17 0 - 44 U/L   Alkaline Phosphatase 46 38 - 126 U/L   Total Bilirubin 0.5 0.3 - 1.2 mg/dL   GFR calc non Af Amer >60 >60 mL/min   GFR calc Af Amer >60 >60 mL/min   Anion gap 9 5 - 15    Comment: Performed at [facility], [address], [place], [place] [address]  Ethanol     Status: None   Collection Time: 08/10/19  7:40 PM  Result Value Ref Range   Alcohol, Ethyl (B) <10 <10 mg/dL    Comment: (NOTE) Lowest  detectable limit for serum alcohol is 10 mg/dL. For medical purposes only. Performed at [facility], [address] [place], [place] [address]   Salicylate level     Status: None   Collection Time: 08/10/19  7:40 PM  Result Value Ref Range   Salicylate Lvl <7.0 2.8 - 30.0 mg/dL    Comment: Performed at [facility], [address] [place], [place] [address]  Acetaminophen level     Status: Abnormal   Collection Time: 08/10/19  7:40 PM  Result Value Ref Range   Acetaminophen (Tylenol), Serum <10 (L) 10 - 30 ug/mL    Comment: (NOTE) Therapeutic concentrations vary significantly. A range of 10-30 ug/mL  may be an effective concentration for many patients. However, some  are best treated at concentrations outside of this range. Acetaminophen concentrations >150 ug/mL at 4 hours after ingestion  and >50 ug/mL at 12 hours after ingestion are often associated with  toxic reactions. Performed at [facility], [address] [place], [place] [address]   cbc     Status: Abnormal   Collection Time: 08/10/19  7:40 PM  Result Value Ref Range   WBC 14.9 (H) 4.0 - 10.5 K/uL   RBC 5.18 4.22 - 5.81 MIL/uL   Hemoglobin 16.1 13.0 - 17.0 g/dL   HCT 57.8 46.9 - 62.9 %   MCV 87.6 80.0 - 100.0 fL   MCH 31.1 26.0 - 34.0 pg   MCHC 35.5 30.0 - 36.0 g/dL   RDW 52.8 41.3 - 24.4 %   Platelets 185 150 - 400 K/uL   nRBC 0.0 0.0 - 0.2 %    Comment: Performed at [facility], [address], [place], [place] [address]  Urine Drug Screen, Qualitative     Status: Abnormal   Collection Time: 08/10/19  7:40 PM  Result Value Ref Range   Tricyclic, Ur Screen NONE DETECTED NONE DETECTED   Amphetamines, Ur Screen POSITIVE (A) NONE DETECTED   MDMA (Ecstasy)Ur Screen NONE DETECTED NONE DETECTED   Cocaine Metabolite,Ur [place] NONE DETECTED NONE DETECTED   Opiate, Ur Screen NONE DETECTED NONE DETECTED   Phencyclidine (PCP) Ur S NONE DETECTED NONE  DETECTED   Cannabinoid 50  Ng, Ur [place] POSITIVE (A) NONE DETECTED   Barbiturates, Ur Screen NONE DETECTED NONE DETECTED   Benzodiazepine, Ur Scrn NONE DETECTED NONE DETECTED   Methadone Scn, Ur NONE DETECTED NONE DETECTED    Comment: (NOTE) Tricyclics + metabolites, urine    Cutoff 1000 ng/mL Amphetamines + metabolites, urine  Cutoff 1000 ng/mL MDMA (Ecstasy), urine              Cutoff 500 ng/mL Cocaine Metabolite, urine          Cutoff 300 ng/mL Opiate + metabolites, urine        Cutoff 300 ng/mL Phencyclidine (PCP), urine         Cutoff 25 ng/mL Cannabinoid, urine                 Cutoff 50 ng/mL Barbiturates + metabolites, urine  Cutoff 200 ng/mL Benzodiazepine, urine              Cutoff 200 ng/mL Methadone, urine                   Cutoff 300 ng/mL The urine drug screen provides only a preliminary, unconfirmed analytical test result and should not be used for non-medical purposes. Clinical consideration and professional judgment should be applied to any positive drug screen result due to possible interfering substances. A more specific alternate chemical method must be used in order to obtain a confirmed analytical result. Gas chromatography / mass spectrometry (GC/MS) is the preferred confirmat ory method. Performed at [facility], [address] [place], [place] [address]   Valproic acid level     Status: Abnormal   Collection Time: 08/10/19  7:40 PM  Result Value Ref Range   Valproic Acid Lvl <10 (L) 50.0 - 100.0 ug/mL    Comment: Performed at [facility], [address] [place], [place] [address]  SARS Coronavirus 2 by RT PCR (hospital order, performed in [facility] hospital lab) Nasopharyngeal Nasopharyngeal Swab     Status: None   Collection Time: 08/10/19 11:39 PM   Specimen: Nasopharyngeal Swab  Result Value Ref Range   SARS Coronavirus 2 NEGATIVE NEGATIVE    Comment: (NOTE) If result is NEGATIVE SARS-CoV-2 target nucleic acids are NOT DETECTED. The SARS-CoV-2 RNA is  generally detectable in upper and lower  respiratory specimens during the acute phase of infection. The lowest  concentration of SARS-CoV-2 viral copies this assay can detect is 250  copies / mL. A negative result does not preclude SARS-CoV-2 infection  and should not be used as the sole basis for treatment or other  patient management decisions.  A negative result may occur with  improper specimen collection / handling, submission of specimen other  than nasopharyngeal swab, presence of viral mutation(s) within the  areas targeted by this assay, and inadequate number of viral copies  (<250 copies / mL). A negative result must be combined with clinical  observations, patient history, and epidemiological information. If result is POSITIVE SARS-CoV-2 target nucleic acids are DETECTED. The SARS-CoV-2 RNA is generally detectable in upper and lower  respiratory specimens dur ing the acute phase of infection.  Positive  results are indicative of active infection with SARS-CoV-2.  Clinical  correlation with patient history and other diagnostic information is  necessary to determine patient infection status.  Positive results do  not rule out bacterial infection or co-infection with other viruses. If result is PRESUMPTIVE POSTIVE SARS-CoV-2 nucleic acids MAY BE PRESENT.  A presumptive positive result was obtained on the submitted specimen  and confirmed on repeat testing.  While 2019 novel coronavirus  (SARS-CoV-2) nucleic acids may be present in the submitted sample  additional confirmatory testing may be necessary for epidemiological  and / or clinical management purposes  to differentiate between  SARS-CoV-2 and other Sarbecovirus currently known to infect humans.  If clinically indicated additional testing with an alternate test  methodology [phone]) is advised. The SARS-CoV-2 RNA is generally  detectable in upper and lower respiratory sp ecimens during the acute  phase of  infection. The expected result is Negative. Fact Sheet for Patients:  BoilerBrush.com.cy Fact Sheet for Healthcare Providers: https://pope.com/ This test is not yet approved or cleared by the [place] FDA and has been authorized for detection and/or diagnosis of SARS-CoV-2 by FDA under an Emergency Use Authorization (EUA).  This EUA will remain in effect (meaning this test can be used) for the duration of the COVID-19 declaration under Section 564(b)(1) of the Act, 21 U.S.C. section 360bbb-3(b)(1), unless the authorization is terminated or revoked sooner. Performed at [facility], [address] [place], [place] [address]     Blood Alcohol level:  Lab Results  Component Value Date   ETH <10 08/10/2019   ETH 7 (H) 05/07/2015    Metabolic Disorder Labs:  No results found for: HGBA1C, MPG No results found for: PROLACTIN No results found for: CHOL, TRIG, HDL, CHOLHDL, VLDL, LDLCALC  Current Medications: Current Facility-Administered Medications  Medication Dose Route Frequency Provider Last Rate Last Dose  . acetaminophen (TYLENOL) tablet 650 mg  650 mg Oral Q6H PRN Gillermo Murdoch, NP      . alum & mag hydroxide-simeth (MAALOX/MYLANTA) 200-200-20 MG/5ML suspension 30 mL  30 mL Oral Q4H PRN Gillermo Murdoch, NP      . divalproex (DEPAKOTE) DR tablet 1,000 mg  1,000 mg Oral QHS Gillermo Murdoch, NP      . escitalopram (LEXAPRO) tablet 10 mg  10 mg Oral Daily Clapacs, Jackquline Denmark, MD   10 mg at 08/11/19 1249  . lamoTRIgine (LAMICTAL) tablet 150 mg  150 mg Oral Daily Gillermo Murdoch, NP   150 mg at 08/11/19 0800  . magnesium hydroxide (MILK OF MAGNESIA) suspension 30 mL  30 mL Oral Daily PRN Gillermo Murdoch, NP      . vitamin B-12 (CYANOCOBALAMIN) tablet 1,000 mcg  1,000 mcg Oral Daily Gillermo Murdoch, NP   1,000 mcg at 08/11/19 0800   PTA Medications: Medications Prior to Admission  Medication  Sig Dispense Refill Last Dose  . divalproex (DEPAKOTE) 500 MG DR tablet Take 1,000 mg by mouth at bedtime.     . lamoTRIgine (LAMICTAL) 150 MG tablet Take 150 mg by mouth daily.     . vitamin B-12 (CYANOCOBALAMIN) 1000 MCG tablet Take 1,000 mcg by mouth daily.       Musculoskeletal: Strength & Muscle Tone: within normal limits Gait & Station: normal Patient leans: N/A  Psychiatric Specialty Exam: Physical Exam  Nursing note and vitals reviewed. Constitutional: He appears well-developed and well-nourished.  HENT:  Head: Normocephalic and atraumatic.  Eyes: Pupils are equal, round, and reactive to light. Conjunctivae are normal.  Neck: Normal range of motion.  Cardiovascular: Regular rhythm and normal heart sounds.  Respiratory: Effort normal. No respiratory distress.  GI: Soft.  Musculoskeletal: Normal range of motion.  Neurological: He is alert.  Skin: Skin is warm and dry.  Psychiatric: His speech is delayed. He is slowed. Thought content is not  paranoid. Cognition and memory are normal. He expresses impulsivity. He exhibits a depressed mood. He expresses no homicidal and no suicidal ideation.    Review of Systems  Constitutional: Negative.   HENT: Negative.   Eyes: Negative.   Respiratory: Negative.   Cardiovascular: Negative.   Gastrointestinal: Negative.   Musculoskeletal: Negative.   Skin: Negative.   Neurological: Negative.   Psychiatric/Behavioral: Positive for depression and substance abuse. Negative for hallucinations, memory loss and suicidal ideas. The patient is nervous/anxious. The patient does not have insomnia.     Blood pressure (!) 120/92, pulse 81, temperature 97.8 F (36.6 C), temperature source Oral, resp. rate 18, height 5\' 7"  (1.702 m), weight 79.4 kg, SpO2 99 %.Body mass index is 27.41 kg/m.  General Appearance: Disheveled  Eye Contact:  Fair  Speech:  Clear and Coherent  Volume:  Normal  Mood:  Depressed  Affect:  Congruent  Thought Process:   Goal Directed  Orientation:  Full (Time, Place, and Person)  Thought Content:  Logical  Suicidal Thoughts:  No  Homicidal Thoughts:  No  Memory:  Immediate;   Fair Recent;   Fair Remote;   Fair  Judgement:  Fair  Insight:  Fair  Psychomotor Activity:  Normal  Concentration:  Concentration: Fair  Recall:  of Knowledge:  Fair  Language:  Fair  Akathisia:  No  Handed:  Right  AIMS (if indicated):     Assets:  Desire for Improvement Housing Social Support  ADL's:  Intact  Cognition:  WNL  Sleep:  Number of Hours: 3    Treatment Plan Summary: Daily contact with patient to assess and evaluate symptoms and progress in treatment, Medication management and Plan Continue 15-minute checks.  Patient does not need a sitter and does not appear to be a high risk of suicide in the hospital.  We discussed medical treatment for depression which is his main focus.  Patient has been on Prozac and Zoloft in the past but has only vague memories of them.  I propose that we start Lexapro starting at 10 mg a day.  Side effects and risks and benefits discussed and the patient agrees.  We can continue the Depakote and lamotrigine as he was taking them outside the hospital.  Probably worthwhile to get a Depakote level if one has not been done recently.  Patient will be included in individual and group therapy and activities and assessed by the full treatment team.  Discharge planning will involve reassessment of suicidal ideation and arrangement for outpatient treatment probably to continue at [facility].  Patient has a safe place to live by his report.  Patient is denying or minimizing all substance abuse and does not show any acute interest at engaging in substance abuse treatment.  Observation Level/Precautions:  15 minute checks  Laboratory:  Chemistry Profile  Psychotherapy:    Medications:    Consultations:    Discharge Concerns:    Estimated LOS:  Other:     Physician Treatment Plan for Primary  Diagnosis: MDD (major depressive disorder), severe (HCC) Long Term Goal(s): Improvement in symptoms so as ready for discharge  Short Term Goals: Ability to verbalize feelings will improve, Ability to disclose and discuss suicidal ideas and Ability to demonstrate self-control will improve  Physician Treatment Plan for Secondary Diagnosis: Principal Problem:   MDD (major depressive disorder), severe (HCC) Active Problems:   History of seizure disorder  Long Term Goal(s): Improvement in symptoms so as ready for discharge  Short Term  Goals: Compliance with prescribed medications will improve  I certify that inpatient services furnished can reasonably be expected to improve the patient's condition.    Alethia Berthold, MD 11/19/202012:49 PM

## 2019-08-11 NOTE — BHH Counselor (Signed)
Adult Comprehensive Assessment  Patient ID: Jacob Holmes, male   DOB: March 17, 1976, 43 y.o.   MRN: [id number]  Information Source: Information source: Patient  Current Stressors:  Patient states their primary concerns and needs for treatment are:: Pt reports " depression from the death of my parents and my girlfriend". Patient states their goals for this hospitilization and ongoing recovery are:: Pt reports "I went to [place] but I need something now". Educational / Learning stressors: Pt denies. Employment / Job issues: Pt denies. Family Relationships: Pt reports "my step-kids. My step-son is on drugs and in and out of jail.  My step-daughter is much of the same and she is doing things I don't agree with trying to get the drugs." Financial / Lack of resources (include bankruptcy): Pt denies. Housing / Lack of housing: Pt denies. Physical health (include injuries & life threatening diseases): Pt reports that his diabetic. Social relationships: Pt denies. Substance abuse: Pt reports occassional marijuana use. Bereavement / Loss: Pt reports long-term girlfriend passed away in 01/05/2019 and parents passed a couple of years ago.  Living/Environment/Situation:  Living Arrangements: Non-relatives/Friends Who else lives in the home?: Pt reports he lives with his "brother-in-law". How long has patient lived in current situation?: Pt reports since June 2020. What is atmosphere in current home: Comfortable, Supportive, Loving  Family History:  Marital status: Long term relationship Long term relationship, how long?: 23 years What types of issues is patient dealing with in the relationship?: Pt reports that he was in a long-term relationship for 23 years before his girlfriend passed in Jan 05, 2019. Additional relationship information: Pt reports that he now has a new girlfriend. Are you sexually active?: Yes What is your sexual orientation?: Heterosexual Has your sexual activity been affected by  drugs, alcohol, medication, or emotional stress?: Pt reports "yes, it's been bothered some.  If I am not happy and in the mood it ain't going to happen no matter if my girlfriend wants it." Does patient have children?: Yes How many children?: 2 How is patient's relationship with their children?: Pt has no biological children, however, 2 step-children.  Relationship with step-children is not well.  Childhood History:  By whom was/is the patient raised?: Both parents Additional childhood history information: Pt reports that mom and dad were married, divorced when he was in 9th grade. mother was alcoholic. Description of patient's relationship with caregiver when they were a child: Pt reports that he was closer to his mother. Patient's description of current relationship with people who raised him/her: Pt reports that both parents are deceased. How were you disciplined when you got in trouble as a child/adolescent?: Pt reports "just grounded". Does patient have siblings?: Yes Number of Siblings: 4 Description of patient's current relationship with siblings: Pt reports that he has 3 half-sisters one who is still living.  Also has one brother.  He reports "I am not close to either". Did patient suffer any verbal/emotional/physical/sexual abuse as a child?: No Did patient suffer from severe childhood neglect?: No Has patient ever been sexually abused/assaulted/raped as an adolescent or adult?: No Was the patient ever a victim of a crime or a disaster?: No Witnessed domestic violence?: Yes Has patient been effected by domestic violence as an adult?: No Description of domestic violence: Pt reports "I just try to stay out of it".  Education:  Highest grade of school patient has completed: 9th Currently a student?: No Learning disability?: Yes What learning problems does patient have?: Pt reports "I was a  slow learner".  Employment/Work Situation:   Employment situation: On disability Why is  patient on disability: Pt reports "mental illness and health issues". 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 Did You Receive Any Psychiatric Treatment/Services While in Equities trader?: No(NA) Are There Guns or Other Weapons in Your Home?: No  Financial Resources:   Financial resources: Insurance claims handler, Medicaid Does patient have a Lawyer or guardian?: No  Alcohol/Substance Abuse:   What has been your use of drugs/alcohol within the last 12 months?: Marijuana: 1x week, joint If attempted suicide, did drugs/alcohol play a role in this?: No Alcohol/Substance Abuse Treatment Hx: Denies past history Has alcohol/substance abuse ever caused legal problems?: No  Social Support System:   Patient's Community Support System: Fair Describe Community Support System: Pt reports "my girlfriend". Type of faith/religion: Chritianity How does patient's faith help to cope with current illness?: Pt reports "I pray regularly and have faith".  Leisure/Recreation:   Leisure and Hobbies: Pt reports "I used to like playing the piano."  Strengths/Needs:   What is the patient's perception of their strengths?: Pt reports "I am able to fix things." Patient states these barriers may affect/interfere with their treatment: Pt denies. Patient states these barriers may affect their return to the community: Pt denies.  Discharge Plan:   Currently receiving community mental health services: Yes (From Whom)([facility]) Patient states concerns and preferences for aftercare planning are: Pt reports that he wants to continue with [facility]. Patient states they will know when they are safe and ready for discharge when: Pt reports "just to get something started". Does patient have access to transportation?: Yes(Patients vehicle is in  the ED parking lot.) Does patient have financial barriers related to discharge medications?: No Will patient be returning to same living situation after discharge?: Yes  Summary/Recommendations:   Summary and Recommendations (to be completed by the evaluator): Patient is a 43 year old male from [place], [place] [facility]).   He presents to the hospital following increase in depressive symptoms.  He has a primary diagnosis of Major Depressive Disorder, Severe.  Recommendations include: crisis stabilization, therapeutic milieu, encourage group attendance and participation, medication management for mood stabilization and development of comprehensive mental wellness plan.  Harden Mo. 08/11/2019

## 2019-08-11 NOTE — Progress Notes (Signed)
D - Patient was in his room upon arrival to the unit. Patient was isolative to his room this evening but did come out for snack and medication administration. Patient denies SI/HI/AVH and pain. Patient endorses anxiety and depression stating it started when his girlfriend passed away from Covid earlier this year. Patient given support and encouragement.   A - Patient compliant with medication administration per MD orders and procedures on the unit. Patient given education. Patient informed to let staff know if there are any issues or problems on the unit.   R - Patient being monitored Q 15 minutes for safety per unit protocol. Patient remains safe on the unit.

## 2019-08-11 NOTE — Tx Team (Signed)
Interdisciplinary Treatment and Diagnostic Plan Update  08/11/2019 Time of Session: 900am Jacob Holmes MRN: [id number]  Principal Diagnosis: <principal problem not specified>  Secondary Diagnoses: Active Problems:   MDD (major depressive disorder), severe (HCC)   Current Medications:  Current Facility-Administered Medications  Medication Dose Route Frequency Provider Last Rate Last Dose  . acetaminophen (TYLENOL) tablet 650 mg  650 mg Oral Q6H PRN Caroline Sauger, NP      . alum & mag hydroxide-simeth (MAALOX/MYLANTA) 200-200-20 MG/5ML suspension 30 mL  30 mL Oral Q4H PRN Caroline Sauger, NP      . divalproex (DEPAKOTE) DR tablet 1,000 mg  1,000 mg Oral QHS Caroline Sauger, NP      . lamoTRIgine (LAMICTAL) tablet 150 mg  150 mg Oral Daily Caroline Sauger, NP   150 mg at 08/11/19 0800  . magnesium hydroxide (MILK OF MAGNESIA) suspension 30 mL  30 mL Oral Daily PRN Caroline Sauger, NP      . vitamin B-12 (CYANOCOBALAMIN) tablet 1,000 mcg  1,000 mcg Oral Daily Caroline Sauger, NP   1,000 mcg at 08/11/19 0800   PTA Medications: Medications Prior to Admission  Medication Sig Dispense Refill Last Dose  . divalproex (DEPAKOTE) 500 MG DR tablet Take 1,000 mg by mouth at bedtime.     . lamoTRIgine (LAMICTAL) 150 MG tablet Take 150 mg by mouth daily.     . vitamin B-12 (CYANOCOBALAMIN) 1000 MCG tablet Take 1,000 mcg by mouth daily.       Patient Stressors: Financial difficulties Health problems Medication change or noncompliance Substance abuse  Patient Strengths: Capable of independent living Motivation for treatment/growth Supportive family/friends  Treatment Modalities: Medication Management, Group therapy, Case management,  1 to 1 session with clinician, Psychoeducation, Recreational therapy.   Physician Treatment Plan for Primary Diagnosis: <principal problem not specified> Long Term Goal(s):     Short Term Goals:    Medication  Management: Evaluate patient's response, side effects, and tolerance of medication regimen.  Therapeutic Interventions: 1 to 1 sessions, Unit Group sessions and Medication administration.  Evaluation of Outcomes: Not Met  Physician Treatment Plan for Secondary Diagnosis: Active Problems:   MDD (major depressive disorder), severe ([facility])  Long Term Goal(s):     Short Term Goals:       Medication Management: Evaluate patient's response, side effects, and tolerance of medication regimen.  Therapeutic Interventions: 1 to 1 sessions, Unit Group sessions and Medication administration.  Evaluation of Outcomes: Not Met   RN Treatment Plan for Primary Diagnosis: <principal problem not specified> Long Term Goal(s): Knowledge of disease and therapeutic regimen to maintain health will improve  Short Term Goals: Ability to verbalize frustration and anger appropriately will improve, Ability to demonstrate self-control, Ability to verbalize feelings will improve and Ability to identify and develop effective coping behaviors will improve  Medication Management: RN will administer medications as ordered by provider, will assess and evaluate patient's response and provide education to patient for prescribed medication. RN will report any adverse and/or side effects to prescribing provider.  Therapeutic Interventions: 1 on 1 counseling sessions, Psychoeducation, Medication administration, Evaluate responses to treatment, Monitor vital signs and CBGs as ordered, Perform/monitor CIWA, COWS, AIMS and Fall Risk screenings as ordered, Perform wound care treatments as ordered.  Evaluation of Outcomes: Progressing   LCSW Treatment Plan for Primary Diagnosis: <principal problem not specified> Long Term Goal(s): Safe transition to appropriate next level of care at discharge, Engage patient in therapeutic group addressing interpersonal concerns.  Short Term Goals: Engage patient  in aftercare planning with  referrals and resources, Increase ability to appropriately verbalize feelings, Increase emotional regulation and Increase skills for wellness and recovery  Therapeutic Interventions: Assess for all discharge needs, 1 to 1 time with Social worker, Explore available resources and support systems, Assess for adequacy in community support network, Educate family and significant other(s) on suicide prevention, Complete Psychosocial Assessment, Interpersonal group therapy.  Evaluation of Outcomes: Progressing   Progress in Treatment: Attending groups: No. Participating in groups: No. Taking medication as prescribed: Yes. Toleration medication: Yes. Family/Significant other contact made: No, will contact:  when consent is given Patient understands diagnosis: Yes. Discussing patient identified problems/goals with staff: Yes. Medical problems stabilized or resolved: Yes. Denies suicidal/homicidal ideation: Yes. Issues/concerns per patient self-inventory: No. Other: N/A  New problem(s) identified: No, Describe:  none  New Short Term/Long Term Goal(s): Detox, elimination of AVH/symptoms of psychosis, medication management for mood stabilization; elimination of SI thoughts; development of comprehensive mental wellness/sobriety plan.   Patient Goals:  "I decided to put myself in here to get something started sooner"  Discharge Plan or Barriers: SPE pamphlet, Mobile Crisis information, and AA/NA information provided to patient for additional community support and resources. CSW assessing for appropriate referrals.  Reason for Continuation of Hospitalization: Depression Medication stabilization  Estimated Length of Stay: 3-5 days  Attendees: Patient: Jacob Holmes 08/11/2019 9:47 AM  Physician: Dr Weber Cooks MD 08/11/2019 9:47 AM  Nursing: Polly Cobia RN 08/11/2019 9:47 AM  RN Care Manager: 08/11/2019 9:47 AM  Social Worker: Evalina Field LCSW 08/11/2019 9:47 AM  Recreational Therapist:   08/11/2019 9:47 AM  Other: Sanjuana Kava LCSW 08/11/2019 9:47 AM  Other: Assunta Curtis LCSW 08/11/2019 9:47 AM  Other: 08/11/2019 9:47 AM    Scribe for Treatment Team: Mariann Laster Amear Strojny, LCSW 08/11/2019 9:47 AM

## 2019-08-11 NOTE — Plan of Care (Signed)
Staff education patient on unit programing , continue to reinforce  patient  on the information received Emotional and mental  status  improved .  Voice of no safety concerns . No anger  frustration noted . Encourage healthy wake and sleep pattern. Able to verbalize understanding of medication received . Denies suicidal ideations . Encourage  interaction  with  peer and staff . Encourage to work on coping decisional making  and anxiety concerns . Problem: Self-Concept: Goal: Ability to identify factors that promote anxiety will improve Outcome: Progressing Goal: Level of anxiety will decrease Outcome: Progressing Goal: Ability to modify response to factors that promote anxiety will improve Outcome: Progressing   Problem: Coping: Goal: Ability to identify and develop effective coping behavior will improve Outcome: Progressing   Problem: Self-Concept: Goal: Will verbalize positive feelings about self Outcome: Progressing   Problem: Coping: Goal: Ability to identify and develop effective coping behavior will improve Outcome: Progressing Goal: Ability to interact with others will improve Outcome: Progressing Goal: Demonstration of participation in decision-making regarding own care will improve Outcome: Progressing Goal: Ability to use eye contact when communicating with others will improve Outcome: Progressing   Problem: Self-Concept: Goal: Will verbalize positive feelings about self Outcome: Progressing Goal: Level of anxiety will decrease Outcome: Progressing

## 2019-08-11 NOTE — Progress Notes (Signed)
Admission Note:  43 yr male who presents IVC in no acute distress for the treatment of SI and Depression. Patient appears flat,  Depressed and assertive  Patient is calm and cooperative with admission process, she denies SI/HI/AVH and contracts for safety upon admission. Patient experienced SI because girlfriend recently died in 03-Feb-2023, he expressed sadness and anhedonia  Patient  has Past medical Hx of Bipolar Disorder, Depression, Seizures, Cardiomegaly, and DM without complication . Patient's  skin was assessed and found to be clear no abnormal marks, skin warm, dry and intact, POC and unit policies explained and understanding verbalized. Patient consents was obtained, food and fluids offered, and patient accepted both. 15 minutes safety checks maintained will continue to monitor.

## 2019-08-11 NOTE — BHH Group Notes (Signed)
LCSW Group Therapy Note  08/11/2019 11:28 AM  Type of Therapy/Topic:  Group Therapy:  Balance in Life  Participation Level:  Minimal  Description of Group:    This group will address the concept of balance and how it feels and looks when one is unbalanced. Patients will be encouraged to process areas in their lives that are out of balance and identify reasons for remaining unbalanced. Facilitators will guide patients in utilizing problem-solving interventions to address and correct the stressor making their life unbalanced. Understanding and applying boundaries will be explored and addressed for obtaining and maintaining a balanced life. Patients will be encouraged to explore ways to assertively make their unbalanced needs known to significant others in their lives, using other group members and facilitator for support and feedback.  Therapeutic Goals: 1. Patient will identify two or more emotions or situations they have that consume much of in their lives. 2. Patient will identify signs/triggers that life has become out of balance:  3. Patient will identify two ways to set boundaries in order to achieve balance in their lives:  4. Patient will demonstrate ability to communicate their needs through discussion and/or role plays  Summary of Patient Progress: Pt was present in group and stated agreeance with other pt present for most things. Pt completed the activity but did not discuss what he wrote on his activity. Pt reported that he is dealing with grief from the loss of his girlfriend back in April and reported that he started to go down hill.      Therapeutic Modalities:   Cognitive Behavioral Therapy Solution-Focused Therapy Assertiveness Training  Evalina Field, MSW, LCSW Clinical Social Work 08/11/2019 11:28 AM

## 2019-08-11 NOTE — BHH Suicide Risk Assessment (Signed)
[facility] Admission Suicide Risk Assessment   Nursing information obtained from:  Patient Demographic factors:  NA Current Mental Status:  NA Loss Factors:  NA Historical Factors:  NA Risk Reduction Factors:  NA  Total Time spent with patient: 1 hour Principal Problem: MDD (major depressive disorder), severe (HCC) Diagnosis:  Principal Problem:   MDD (major depressive disorder), severe (HCC)  Subjective Data: Patient seen and chart reviewed.  43 year old man with a history of recurrent depression came to the emergency room stating symptoms of severe depression with reported suicidal ideation.  On interview today however the patient says he has no intention or plan of hurting or killing himself.  He does however continue to endorse severe depression with consistently down mood feelings of frequent sadness and frequent crying spells.  Denies any psychotic symptoms.  States that he is very willing to engage in appropriate treatment  Continued Clinical Symptoms:  Alcohol Use Disorder Identification Test Final Score (AUDIT): 8 The "Alcohol Use Disorders Identification Test", Guidelines for Use in Primary Care, Second Edition.  World Science writer [facility]). Score between 0-7:  no or low risk or alcohol related problems. Score between 8-15:  moderate risk of alcohol related problems. Score between 16-19:  high risk of alcohol related problems. Score 20 or above:  warrants further diagnostic evaluation for alcohol dependence and treatment.   CLINICAL FACTORS:   Depression:   Impulsivity   Musculoskeletal: Strength & Muscle Tone: within normal limits Gait & Station: normal Patient leans: N/A  Psychiatric Specialty Exam: Physical Exam  Nursing note and vitals reviewed. Constitutional: He appears well-developed and well-nourished.  HENT:  Head: Normocephalic and atraumatic.  Eyes: Pupils are equal, round, and reactive to light. Conjunctivae are normal.  Neck: Normal range of motion.   Cardiovascular: Regular rhythm and normal heart sounds.  Respiratory: Effort normal.  GI: Soft.  Musculoskeletal: Normal range of motion.  Neurological: He is alert.  Skin: Skin is warm and dry.  Psychiatric: Judgment normal. His affect is blunt. His speech is delayed. He is slowed. Thought content is not paranoid. Cognition and memory are normal. He exhibits a depressed mood. He expresses no homicidal and no suicidal ideation.    Review of Systems  Constitutional: Negative.   HENT: Negative.   Eyes: Negative.   Respiratory: Negative.   Cardiovascular: Negative.   Gastrointestinal: Negative.   Musculoskeletal: Negative.   Skin: Negative.   Neurological: Negative.   Psychiatric/Behavioral: Positive for depression. Negative for hallucinations, memory loss, substance abuse and suicidal ideas. The patient is nervous/anxious. The patient does not have insomnia.     Blood pressure (!) 120/92, pulse 81, temperature 97.8 F (36.6 C), temperature source Oral, resp. rate 18, height 5\' 7"  (1.702 m), weight 79.4 kg, SpO2 99 %.Body mass index is 27.41 kg/m.  General Appearance: Casual  Eye Contact:  Fair  Speech:  Clear and Coherent  Volume:  Normal  Mood:  Euthymic  Affect:  Constricted  Thought Process:  Goal Directed  Orientation:  Full (Time, Place, and Person)  Thought Content:  Logical  Suicidal Thoughts:  No  Homicidal Thoughts:  No  Memory:  Immediate;   Fair Recent;   Fair Remote;   Fair  Judgement:  Fair  Insight:  Fair  Psychomotor Activity:  Normal  Concentration:  Concentration: Fair  Recall:  of Knowledge:  Fair  Language:  Fair  Akathisia:  No  Handed:  Right  AIMS (if indicated):     Assets:  Desire for  Improvement Housing Physical Health Social Support  ADL's:  Intact  Cognition:  WNL  Sleep:  Number of Hours: 3      COGNITIVE FEATURES THAT CONTRIBUTE TO RISK:  Thought constriction (tunnel vision)    SUICIDE RISK:   Mild:  Suicidal  ideation of limited frequency, intensity, duration, and specificity.  There are no identifiable plans, no associated intent, mild dysphoria and related symptoms, good self-control (both objective and subjective assessment), few other risk factors, and identifiable protective factors, including available and accessible social support.  PLAN OF CARE: Patient admitted to the psychiatric ward.  Continue 15-minute checks.  Discussed treatment with him and start antidepressant medicine.  Engage in individual and group therapy and activities for treatment and assessment.  He is already working with [facility] now about arranging for outpatient care.  I certify that inpatient services furnished can reasonably be expected to improve the patient's condition.   Alethia Berthold, MD 08/11/2019, 12:44 PM

## 2019-08-11 NOTE — B[facility] Suicide Risk Assessment (Signed)
Jacob Holmes INPATIENT:  Family/Significant Other Suicide Prevention Education  Suicide Prevention Education:  Education Completed; Jacob Holmes, girlfriend, [phone] has been identified by the patient as the family member/significant other with whom the patient will be residing, and identified as the person(s) who will aid the patient in the event of a mental health crisis (suicidal ideations/suicide attempt).  With written consent from the patient, the family member/significant other has been provided the following suicide prevention education, prior to the and/or following the discharge of the patient.  The suicide prevention education provided includes the following:  Suicide risk factors  Suicide prevention and interventions  National Suicide Hotline telephone number  [facility] assessment telephone number  [facility] Emergency Assistance New Site and/or Residential Mobile Crisis Unit telephone number  Request made of family/significant other to:  Remove weapons (e.g., guns, rifles, knives), all items previously/currently identified as safety concern.    Remove drugs/medications (over-the-counter, prescriptions, illicit drugs), all items previously/currently identified as a safety concern.  The family member/significant other verbalizes understanding of the suicide prevention education information provided.  The family member/significant other agrees to remove the items of safety concern listed above.  She reports that the patient has been struggling with the passing of his long-term girlfriend and that it has brought up memories of his mothers passing.  She reports that the patient found his mother and mother passed away in his arms.  She reports that patient has no access to weapons and that patient reports that he is not suicidal.  She reports that patient struggles with letting the past go.    Rozann Lesches 08/11/2019, 11:22 AM

## 2019-08-11 NOTE — Tx Team (Signed)
Initial Treatment Plan 08/11/2019 2:16 AM Babs Sciara TSV:[id number]    PATIENT STRESSORS: Financial difficulties Health problems Medication change or noncompliance Substance abuse   PATIENT STRENGTHS: Capable of independent living Motivation for treatment/growth Supportive family/friends   PATIENT IDENTIFIED PROBLEMS: Suicidal Ideation    Illicit Drug Use     Depression/Anxiety    Body image disturbance           DISCHARGE CRITERIA:  Ability to meet basic life and health needs Adequate post-discharge living arrangements Medical problems require only outpatient monitoring Need for constant or close observation no longer present Reduction of life-threatening or endangering symptoms to within safe limits  PRELIMINARY DISCHARGE PLAN: Attend PHP/IOP Attend 12-step recovery group Outpatient therapy Participate in family therapy Return to previous living arrangement  PATIENT/FAMILY INVOLVEMENT: This treatment plan has been presented to and reviewed with the patient, Jacob Holmes,   The patient and  have been given the opportunity to ask questions and make suggestions.  Clemens Catholic, RN 08/11/2019, 2:16 AM

## 2019-08-12 MED ORDER — DIVALPROEX SODIUM 500 MG PO DR TAB: 1000.0000 mg | DELAYED_RELEASE_TABLET | Freq: Every day | ORAL | 1 refills | Status: AC

## 2019-08-12 MED ORDER — ESCITALOPRAM OXALATE 10 MG PO TABS: 10.0000 mg | ORAL_TABLET | Freq: Every day | ORAL | 1 refills | Status: AC

## 2019-08-12 MED ORDER — VITAMIN B-12 1000 MCG PO TABS: 1000.0000 ug | ORAL_TABLET | Freq: Every day | ORAL | 1 refills | Status: AC

## 2019-08-12 MED ORDER — LAMOTRIGINE 150 MG PO TABS: 150.0000 mg | ORAL_TABLET | Freq: Every day | ORAL | 1 refills | Status: AC

## 2019-08-12 NOTE — Plan of Care (Signed)
Patient compliant with medication administration per MD orders  Problem: Medication: Goal: Compliance with prescribed medication regimen will improve Outcome: Progressing   

## 2019-08-12 NOTE — [facility] Suicide Risk Assessment (Signed)
[facility] Discharge Suicide Risk Assessment   Principal Problem: MDD (major depressive disorder), severe ([place]) Discharge Diagnoses: Principal Problem:   MDD (major depressive disorder), severe ([place]) Active Problems:   History of seizure disorder   Total Time spent with patient: 30 minutes  Musculoskeletal: Strength & Muscle Tone: within normal limits Gait & Station: normal Patient leans: N/A  Psychiatric Specialty Exam: Review of Systems  Constitutional: Negative.   HENT: Negative.   Eyes: Negative.   Respiratory: Negative.   Cardiovascular: Negative.   Gastrointestinal: Negative.   Musculoskeletal: Negative.   Skin: Negative.   Neurological: Negative.   Psychiatric/Behavioral: Negative.     Blood pressure (!) 138/95, pulse 88, temperature 98 F (36.7 C), temperature source Oral, resp. rate 17, height 5\' 7"  (1.702 m), weight 79.4 kg, SpO2 99 %.Body mass index is 27.41 kg/m.  General Appearance: Casual  Eye Contact::  Good  Speech:  Clear and [id number]  Volume:  Normal  Mood:  Euthymic  Affect:  Congruent  Thought Process:  Goal Directed  Orientation:  Full (Time, Place, and Person)  Thought Content:  Logical  Suicidal Thoughts:  No  Homicidal Thoughts:  No  Memory:  Immediate;   Fair Recent;   Fair Remote;   Fair  Judgement:  Fair  Insight:  Fair  Psychomotor Activity:  Normal  Concentration:  Fair  Recall:  [employer] of Knowledge:Fair  Language: Fair  Akathisia:  No  Handed:  Right  AIMS (if indicated):     Assets:  Desire for Improvement Physical Health Social Support  Sleep:  Number of Hours: 8  Cognition: WNL  ADL's:  Intact   Mental Status Per Nursing Assessment::   On Admission:  NA  Demographic Factors:  Male  Loss Factors: Loss of significant relationship  Historical Factors: Impulsivity  Risk Reduction Factors:   Sense of responsibility to family, Religious beliefs about death, Living with another person, especially a relative,  Positive social support and Positive therapeutic relationship  Continued Clinical Symptoms:  Depression:   Anhedonia  Cognitive Features That Contribute To Risk:  None    Suicide Risk:  Minimal: No identifiable suicidal ideation.  Patients presenting with no risk factors but with morbid ruminations; may be classified as minimal risk based on the severity of the depressive symptoms  Follow-up Information    [place] Follow up on 08/17/2019.   Why: You have a hospital discharge appointment scheduled for Wednesday 08/17/2019 at 9:30AM, it will be in person. You have a psychiatry appointment scheduled with Dr. Sabino Gasser on 09/19/19 at 9:20AM. Thank You! Contact information: [place] [address] [phone]           Plan Of Care/Follow-up recommendations:  Activity:  Activity as tolerated Diet:  Regular diet Other:  Follow-up with outpatient treatment with [facility]  Alethia Berthold, MD 08/12/2019, 9:24 AM

## 2019-08-12 NOTE — Progress Notes (Signed)
Recreation Therapy Notes    Date: 08/12/2019  Time: 9:30 am  Location: Craft room  Behavioral response: Appropriate   Intervention Topic: Relaxation    Discussion/Intervention:   Group content today was focused on relaxation. The group defined relaxation and identified healthy ways to relax. Individuals expressed how much time they spend relaxing. Patients expressed how much their life would be if they did not make time for themselves to relax. The group stated ways they could improve their relaxation techniques in the future.  Individuals participated in the intervention "Time to Relax" where they had a chance to experience different relaxation techniques.  Clinical Observations/Feedback:  Patient came to group and defined relaxation as sleep. He explained that he likes to listen to piano music to relax.   Individual participated in the intervention and was social with peers and staff during group. Rhylei Mcquaig LRT/CTRS         Eren Ryser 08/12/2019 11:18 AM

## 2019-08-12 NOTE — Progress Notes (Signed)
Patient denies SI/HI, denies A/V hallucinations. Patient verbalizes understanding of discharge instructions, follow up care and prescriptions. Patient given all belongings from [facility] locker. Patient escorted out by staff, transported by himself.

## 2019-08-12 NOTE — Progress Notes (Signed)
  [facility] Adult Case Management Discharge Plan :  Will you be returning to the same living situation after discharge:  Yes,  home At discharge, do you have transportation home?: Yes,  pts car is in the parking lot Do you have the ability to pay for your medications: Yes,  medicaid  Release of information consent forms completed and in the chart;    Patient to Follow up at: Follow-up Information    [place] Follow up on 08/17/2019.   Why: You have a hospital discharge appointment scheduled for Wednesday 08/17/2019 at 9:30AM, it will be in person. You have a psychiatry appointment scheduled with Dr. Sabino Gasser on 09/19/19 at 9:20AM. Thank You! Contact information: [place] [address] [phone]           Next level of care provider has access to [place] and Suicide Prevention discussed: Yes,  SPE completed with pts girlfriend  Have you used any form of tobacco in the last 30 days? (Cigarettes, Smokeless Tobacco, Cigars, and/or Pipes): Yes  Has patient been referred to the Quitline?: Patient refused referral  Patient has been referred for addiction treatment: Pt. refused referral  [place], LCSW 08/12/2019, 10:06 AM

## 2019-08-12 NOTE — Discharge Summary (Signed)
Physician Discharge Summary Note  Patient:  Jacob Holmes is an 43 y.o., male MRN:  [id number] DOB:  October 30, 1975 Patient phone:  [phone] (home)  Patient address:   [place] [address],  Total Time spent with patient: 30 minutes  Date of Admission:  08/11/2019 Date of Discharge: 08/12/2019  Reason for Admission: Patient was admitted because he presented to the emergency services reporting suicidal ideation along with his depression  Principal Problem: MDD (major depressive disorder), severe ([place]) Discharge Diagnoses: Principal Problem:   MDD (major depressive disorder), severe ([place]) Active Problems:   History of seizure disorder   Past Psychiatric History: Patient has a past history of recurrent depression  Past Medical History:  Past Medical History:  Diagnosis Date  . Bipolar disorder ([place])   . Cardiomegaly   . Diabetes mellitus without complication ([place])   . Seizures ([place])     Past Surgical History:  Procedure Laterality Date  . HERNIA REPAIR     Family History: History reviewed. No pertinent family history. Family Psychiatric  History: None reported Social History:  Social History   Substance and Sexual Activity  Alcohol Use No     Social History   Substance and Sexual Activity  Drug Use Not on file    Social History   Socioeconomic History  . Marital status: Single    Spouse name: Not on file  . Number of children: Not on file  . Years of education: Not on file  . Highest education level: Not on file  Occupational History  . Not on file  Social Needs  . Financial resource strain: Not on file  . Food insecurity    Worry: Not on file    Inability: Not on file  . Transportation needs    Medical: Not on file    Non-medical: Not on file  Tobacco Use  . Smoking status: Current Every Day Smoker    Packs/day: 2.00    Years: 25.00    Pack years: 50.00    Types: Cigarettes  . Smokeless tobacco: Never Used   Substance and Sexual Activity  . Alcohol use: No  . Drug use: Not on file  . Sexual activity: Not on file  Lifestyle  . Physical activity    Days per week: Not on file    Minutes per session: Not on file  . Stress: Not on file  Relationships  . Social Herbalist on phone: Not on file    Gets together: Not on file    Attends religious service: Not on file    Active member of club or organization: Not on file    Attends meetings of clubs or organizations: Not on file    Relationship status: Not on file  Other Topics Concern  . Not on file  Social History Narrative  . Not on file    Hospital Course: Patient admitted to the psychiatric unit.  15-minute checks maintained.  Patient showed no dangerous aggressive or violent behavior.  Consistently denied suicidal ideation once admitted.  Told me that he had only said he was suicidal because he was desperate to get seen by a psychiatrist sooner rather than later.  He was appropriate on the unit.  He was started on Lexapro 10 mg a day for depression.  Received counseling about the medication and appropriate outpatient treatment.  At the time of discharge is denying any suicidal thoughts.  Very clearly able to articulate multiple reasons  including religious ones for not harming himself.  Patient reports he is looking forward to being with his girlfriend this weekend (this is his current girlfriend who is still alive).  Patient will be discharged and already has follow-up with [facility].  Physical Findings: AIMS: Facial and Oral Movements Muscles of Facial Expression: None, normal Lips and Perioral Area: None, normal Jaw: None, normal Tongue: None, normal,Extremity Movements Upper (arms, wrists, hands, fingers): None, normal Lower (legs, knees, ankles, toes): None, normal, Trunk Movements Neck, shoulders, hips: None, normal, Overall Severity Severity of abnormal movements (highest score from questions above): None, normal Incapacitation  due to abnormal movements: None, normal Patient's awareness of abnormal movements (rate only patient's report): No Awareness, Dental Status Current problems with teeth and/or dentures?: No Does patient usually wear dentures?: No  CIWA:  CIWA-Ar Total: 2 COWS:  COWS Total Score: 2  Musculoskeletal: Strength & Muscle Tone: within normal limits Gait & Station: normal Patient leans: N/A  Psychiatric Specialty Exam: Physical Exam  Nursing note and vitals reviewed. Constitutional: He appears well-developed and well-nourished.  HENT:  Head: Normocephalic and atraumatic.  Eyes: Pupils are equal, round, and reactive to light. Conjunctivae are normal.  Neck: Normal range of motion.  Cardiovascular: Regular rhythm and normal heart sounds.  Respiratory: Effort normal. No respiratory distress.  GI: Soft.  Musculoskeletal: Normal range of motion.  Neurological: He is alert.  Skin: Skin is warm and dry.  Psychiatric: He has a normal mood and affect. His speech is normal and behavior is normal. Judgment and thought content normal. Cognition and memory are normal.    Review of Systems  Constitutional: Negative.   HENT: Negative.   Eyes: Negative.   Respiratory: Negative.   Cardiovascular: Negative.   Gastrointestinal: Negative.   Musculoskeletal: Negative.   Skin: Negative.   Neurological: Negative.   Psychiatric/Behavioral: Negative.     Blood pressure (!) 138/95, pulse 88, temperature 98 F (36.7 C), temperature source Oral, resp. rate 17, height 5\' 7"  (1.702 m), weight 79.4 kg, SpO2 99 %.Body mass index is 27.41 kg/m.  General Appearance: Casual  Eye Contact:  Good  Speech:  Clear and Coherent  Volume:  Normal  Mood:  Euthymic  Affect:  Constricted  Thought Process:  Coherent  Orientation:  Full (Time, Place, and Person)  Thought Content:  Logical  Suicidal Thoughts:  No  Homicidal Thoughts:  No  Memory:  Immediate;   Fair Recent;   Fair Remote;   Fair  Judgement:  Fair   Insight:  Fair  Psychomotor Activity:  Normal  Concentration:  Concentration: Fair  Recall:  [employer]  Fund of Knowledge:  Fair  Language:  Fair  Akathisia:  No  Handed:  Right  AIMS (if indicated):     Assets:  Desire for Improvement  ADL's:  Intact  Cognition:  WNL  Sleep:  Number of Hours: 8     Have you used any form of tobacco in the last 30 days? (Cigarettes, Smokeless Tobacco, Cigars, and/or Pipes): Yes  Has this patient used any form of tobacco in the last 30 days? (Cigarettes, Smokeless Tobacco, Cigars, and/or Pipes) Yes, Yes, A prescription for an FDA-approved tobacco cessation medication was offered at discharge and the patient refused  Blood Alcohol level:  Lab Results  Component Value Date   ETH <10 08/10/2019   ETH 7 (H) 05/07/2015    Metabolic Disorder Labs:  No results found for: HGBA1C, MPG No results found for: PROLACTIN No results found for: CHOL, TRIG, HDL,  CHOLHDL, VLDL, [facility]  See Psychiatric Specialty Exam and Suicide Risk Assessment completed by Attending Physician prior to discharge.  Discharge destination:  Home  Is patient on multiple antipsychotic therapies at discharge:  No   Has Patient had three or more failed trials of antipsychotic monotherapy by history:  No  Recommended Plan for Multiple Antipsychotic Therapies: NA  Discharge Instructions    Diet - low sodium heart healthy   Complete by: As directed    Increase activity slowly   Complete by: As directed      Allergies as of 08/12/2019   No Known Allergies     Medication List    TAKE these medications     Indication  divalproex 500 MG DR tablet Commonly known as: DEPAKOTE Take 2 tablets (1,000 mg total) by mouth at bedtime.  Indication: Complex Absence Seizures   escitalopram 10 MG tablet Commonly known as: LEXAPRO Take 1 tablet (10 mg total) by mouth daily. Start taking on: August 13, 2019  Indication: Major Depressive Disorder   lamoTRIgine 150 MG  tablet Commonly known as: LAMICTAL Take 1 tablet (150 mg total) by mouth daily. Start taking on: August 13, 2019  Indication: Depressive Phase of Manic-Depression   vitamin B-12 1000 MCG tablet Commonly known as: CYANOCOBALAMIN Take 1 tablet (1,000 mcg total) by mouth daily.  Indication: Inadequate Vitamin B12      Follow-up Information    [facility] Follow up on 08/17/2019.   Why: You have a hospital discharge appointment scheduled for Wednesday 08/17/2019 at 9:30AM, it will be in person. You have a psychiatry appointment scheduled with Dr. Holly Bodily on 09/19/19 at 9:20AM. Thank You! Contact information: [address] [place] [address] [phone]           Follow-up recommendations:  Activity:  Activity as tolerated Diet:  Regular diet Other:  Follow-up at [facility]  Comments: Prescriptions provided at discharge  Signed: Mordecai Rasmussen, MD 08/12/2019, 9:30 AM

## 2019-08-17 DIAGNOSIS — F322 Major depressive disorder, single episode, severe without psychotic features: Secondary | ICD-10-CM | POA: Diagnosis not present

## 2019-10-21 DIAGNOSIS — F322 Major depressive disorder, single episode, severe without psychotic features: Secondary | ICD-10-CM | POA: Diagnosis not present

## 2019-11-29 DIAGNOSIS — M549 Dorsalgia, unspecified: Secondary | ICD-10-CM | POA: Diagnosis not present

## 2019-11-29 DIAGNOSIS — J449 Chronic obstructive pulmonary disease, unspecified: Secondary | ICD-10-CM | POA: Diagnosis not present

## 2019-11-29 DIAGNOSIS — R1013 Epigastric pain: Secondary | ICD-10-CM | POA: Diagnosis not present

## 2019-11-29 DIAGNOSIS — F319 Bipolar disorder, unspecified: Secondary | ICD-10-CM | POA: Diagnosis not present

## 2019-11-29 DIAGNOSIS — K219 Gastro-esophageal reflux disease without esophagitis: Secondary | ICD-10-CM | POA: Diagnosis not present

## 2019-11-29 DIAGNOSIS — E7800 Pure hypercholesterolemia, unspecified: Secondary | ICD-10-CM | POA: Diagnosis not present

## 2019-11-29 DIAGNOSIS — E119 Type 2 diabetes mellitus without complications: Secondary | ICD-10-CM | POA: Diagnosis not present

## 2019-11-29 DIAGNOSIS — Z716 Tobacco abuse counseling: Secondary | ICD-10-CM | POA: Diagnosis not present

## 2019-11-29 DIAGNOSIS — Z1331 Encounter for screening for depression: Secondary | ICD-10-CM | POA: Diagnosis not present

## 2020-01-08 DIAGNOSIS — K047 Periapical abscess without sinus: Secondary | ICD-10-CM | POA: Diagnosis not present

## 2020-02-27 ENCOUNTER — Other Ambulatory Visit: Payer: Self-pay | Admitting: Psychiatry

## 2020-03-06 DIAGNOSIS — E119 Type 2 diabetes mellitus without complications: Secondary | ICD-10-CM | POA: Diagnosis not present

## 2020-03-06 DIAGNOSIS — F319 Bipolar disorder, unspecified: Secondary | ICD-10-CM | POA: Diagnosis not present

## 2020-03-06 DIAGNOSIS — E7800 Pure hypercholesterolemia, unspecified: Secondary | ICD-10-CM | POA: Diagnosis not present

## 2020-03-06 DIAGNOSIS — R1013 Epigastric pain: Secondary | ICD-10-CM | POA: Diagnosis not present

## 2020-03-06 DIAGNOSIS — E559 Vitamin D deficiency, unspecified: Secondary | ICD-10-CM | POA: Diagnosis not present

## 2020-03-06 DIAGNOSIS — J449 Chronic obstructive pulmonary disease, unspecified: Secondary | ICD-10-CM | POA: Diagnosis not present

## 2020-03-06 DIAGNOSIS — Z716 Tobacco abuse counseling: Secondary | ICD-10-CM | POA: Diagnosis not present

## 2020-03-07 DIAGNOSIS — H524 Presbyopia: Secondary | ICD-10-CM | POA: Diagnosis not present

## 2020-03-08 DIAGNOSIS — H5213 Myopia, bilateral: Secondary | ICD-10-CM | POA: Diagnosis not present

## 2020-03-22 DIAGNOSIS — Z419 Encounter for procedure for purposes other than remedying health state, unspecified: Secondary | ICD-10-CM | POA: Diagnosis not present

## 2020-04-22 DIAGNOSIS — Z419 Encounter for procedure for purposes other than remedying health state, unspecified: Secondary | ICD-10-CM | POA: Diagnosis not present

## 2020-05-23 DIAGNOSIS — Z419 Encounter for procedure for purposes other than remedying health state, unspecified: Secondary | ICD-10-CM | POA: Diagnosis not present

## 2020-06-22 DIAGNOSIS — Z419 Encounter for procedure for purposes other than remedying health state, unspecified: Secondary | ICD-10-CM | POA: Diagnosis not present

## 2020-10-24 DIAGNOSIS — Z6830 Body mass index (BMI) 30.0-30.9, adult: Secondary | ICD-10-CM | POA: Diagnosis not present

## 2020-10-24 DIAGNOSIS — I25118 Atherosclerotic heart disease of native coronary artery with other forms of angina pectoris: Secondary | ICD-10-CM | POA: Diagnosis not present

## 2020-11-16 DIAGNOSIS — Z23 Encounter for immunization: Secondary | ICD-10-CM | POA: Diagnosis not present

## 2020-11-16 DIAGNOSIS — R079 Chest pain, unspecified: Secondary | ICD-10-CM | POA: Diagnosis not present

## 2020-12-12 DIAGNOSIS — M25512 Pain in left shoulder: Secondary | ICD-10-CM | POA: Diagnosis not present

## 2020-12-12 DIAGNOSIS — M25511 Pain in right shoulder: Secondary | ICD-10-CM | POA: Diagnosis not present

## 2021-01-03 DIAGNOSIS — R079 Chest pain, unspecified: Secondary | ICD-10-CM | POA: Diagnosis not present

## 2021-01-08 DIAGNOSIS — E118 Type 2 diabetes mellitus with unspecified complications: Secondary | ICD-10-CM | POA: Diagnosis not present

## 2021-01-08 DIAGNOSIS — G40909 Epilepsy, unspecified, not intractable, without status epilepticus: Secondary | ICD-10-CM | POA: Diagnosis not present

## 2021-01-08 DIAGNOSIS — R0602 Shortness of breath: Secondary | ICD-10-CM | POA: Diagnosis not present

## 2021-01-08 DIAGNOSIS — R079 Chest pain, unspecified: Secondary | ICD-10-CM | POA: Diagnosis not present

## 2021-06-19 DIAGNOSIS — F4312 Post-traumatic stress disorder, chronic: Secondary | ICD-10-CM | POA: Diagnosis not present

## 2021-06-19 DIAGNOSIS — F314 Bipolar disorder, current episode depressed, severe, without psychotic features: Secondary | ICD-10-CM | POA: Diagnosis not present

## 2021-06-19 DIAGNOSIS — Z5181 Encounter for therapeutic drug level monitoring: Secondary | ICD-10-CM | POA: Diagnosis not present

## 2021-06-19 DIAGNOSIS — F410 Panic disorder [episodic paroxysmal anxiety] without agoraphobia: Secondary | ICD-10-CM | POA: Diagnosis not present

## 2021-06-19 DIAGNOSIS — Z7689 Persons encountering health services in other specified circumstances: Secondary | ICD-10-CM | POA: Diagnosis not present

## 2021-06-22 DIAGNOSIS — Z419 Encounter for procedure for purposes other than remedying health state, unspecified: Secondary | ICD-10-CM | POA: Diagnosis not present

## 2021-06-27 ENCOUNTER — Ambulatory Visit: Payer: Self-pay | Admitting: Family Medicine

## 2021-07-17 DIAGNOSIS — F410 Panic disorder [episodic paroxysmal anxiety] without agoraphobia: Secondary | ICD-10-CM | POA: Diagnosis not present

## 2021-07-17 DIAGNOSIS — F314 Bipolar disorder, current episode depressed, severe, without psychotic features: Secondary | ICD-10-CM | POA: Diagnosis not present

## 2021-07-17 DIAGNOSIS — F4312 Post-traumatic stress disorder, chronic: Secondary | ICD-10-CM | POA: Diagnosis not present

## 2021-07-23 DIAGNOSIS — Z419 Encounter for procedure for purposes other than remedying health state, unspecified: Secondary | ICD-10-CM | POA: Diagnosis not present

## 2021-08-07 DIAGNOSIS — F4312 Post-traumatic stress disorder, chronic: Secondary | ICD-10-CM | POA: Diagnosis not present

## 2021-08-07 DIAGNOSIS — F314 Bipolar disorder, current episode depressed, severe, without psychotic features: Secondary | ICD-10-CM | POA: Diagnosis not present

## 2021-08-07 DIAGNOSIS — F410 Panic disorder [episodic paroxysmal anxiety] without agoraphobia: Secondary | ICD-10-CM | POA: Diagnosis not present

## 2021-08-22 DIAGNOSIS — Z419 Encounter for procedure for purposes other than remedying health state, unspecified: Secondary | ICD-10-CM | POA: Diagnosis not present

## 2021-09-04 ENCOUNTER — Ambulatory Visit: Payer: Medicaid Other | Admitting: Nurse Practitioner

## 2021-09-04 NOTE — Progress Notes (Deleted)
There were no vitals taken for this visit.   Subjective:    Patient ID: Jacob Holmes, male    DOB: 1975-10-21, 45 y.o.   MRN: [id number]  HPI: Jacob Holmes is a 45 y.o. male  No chief complaint on file.  Patient presents to clinic to establish care with new PCP.  Introduced to Publishing rights manager role and practice setting.  All questions answered.  Discussed provider/patient relationship and expectations.  Patient reports a history of ***. Patient denies a history of: Hypertension, Elevated Cholesterol, Diabetes, Thyroid problems, Depression, Anxiety, Neurological problems, and Abdominal problems.   Active Ambulatory Problems    Diagnosis Date Noted   Opioid type dependence, continuous (HCC) 05/09/2015   Severe recurrent major depression without psychotic features (HCC) 05/09/2015   Polysubstance abuse (HCC) 05/09/2015   Major depression 05/11/2015   MDD (major depressive disorder), severe (HCC) 08/11/2019   History of seizure disorder 08/11/2019   Resolved Ambulatory Problems    Diagnosis Date Noted   Bipolar I disorder, most recent episode depressed (HCC) 05/09/2015   Past Medical History:  Diagnosis Date   Bipolar disorder (HCC)    Cardiomegaly    Diabetes mellitus without complication (HCC)    Seizures (HCC)    Past Surgical History:  Procedure Laterality Date   HERNIA REPAIR     No family history on file.   Review of Systems  Per HPI unless specifically indicated above     Objective:    There were no vitals taken for this visit.  Wt Readings from Last 3 Encounters:  08/11/19 175 lb (79.4 kg)  08/10/19 164 lb (74.4 kg)  08/24/16 181 lb (82.1 kg)    Physical Exam  Results for orders placed or performed during the hospital encounter of 08/10/19  SARS Coronavirus 2 by RT PCR (hospital order, performed in [facility] hospital lab) Nasopharyngeal Nasopharyngeal Swab   Specimen: Nasopharyngeal Swab  Result Value Ref Range   SARS Coronavirus 2  NEGATIVE NEGATIVE  Comprehensive metabolic panel  Result Value Ref Range   Sodium 138 135 - 145 mmol/L   Potassium 4.2 3.5 - 5.1 mmol/L   Chloride 104 98 - 111 mmol/L   CO2 25 22 - 32 mmol/L   Glucose, Bld 194 (H) 70 - 99 mg/dL   BUN 18 6 - 20 mg/dL   Creatinine, Ser 6.76 0.61 - 1.24 mg/dL   Calcium 9.2 8.9 - 72.0 mg/dL   Total Protein 6.8 6.5 - 8.1 g/dL   Albumin 4.0 3.5 - 5.0 g/dL   AST 13 (L) 15 - 41 U/L   ALT 17 0 - 44 U/L   Alkaline Phosphatase 46 38 - 126 U/L   Total Bilirubin 0.5 0.3 - 1.2 mg/dL   GFR calc non Af Amer >60 >60 mL/min   GFR calc Af Amer >60 >60 mL/min   Anion gap 9 5 - 15  Ethanol  Result Value Ref Range   Alcohol, Ethyl (B) <10 <10 mg/dL  Salicylate level  Result Value Ref Range   Salicylate Lvl <7.0 2.8 - 30.0 mg/dL  Acetaminophen level  Result Value Ref Range   Acetaminophen (Tylenol), Serum <10 (L) 10 - 30 ug/mL  cbc  Result Value Ref Range   WBC 14.9 (H) 4.0 - 10.5 K/uL   RBC 5.18 4.22 - 5.81 MIL/uL   Hemoglobin 16.1 13.0 - 17.0 g/dL   HCT 94.7 09.6 - 28.3 %   MCV 87.6 80.0 - 100.0 fL   MCH 31.1  26.0 - 34.0 pg   MCHC 35.5 30.0 - 36.0 g/dL   RDW 30.0 51.1 - 02.1 %   Platelets 185 150 - 400 K/uL   nRBC 0.0 0.0 - 0.2 %  Urine Drug Screen, Qualitative  Result Value Ref Range   Tricyclic, Ur Screen NONE DETECTED NONE DETECTED   Amphetamines, Ur Screen POSITIVE (A) NONE DETECTED   MDMA (Ecstasy)Ur Screen NONE DETECTED NONE DETECTED   Cocaine Metabolite,Ur [place] NONE DETECTED NONE DETECTED   Opiate, Ur Screen NONE DETECTED NONE DETECTED   Phencyclidine (PCP) Ur S NONE DETECTED NONE DETECTED   Cannabinoid 50 Ng, Ur [place] POSITIVE (A) NONE DETECTED   Barbiturates, Ur Screen NONE DETECTED NONE DETECTED   Benzodiazepine, Ur Scrn NONE DETECTED NONE DETECTED   Methadone Scn, Ur NONE DETECTED NONE DETECTED  Valproic acid level  Result Value Ref Range   Valproic Acid Lvl <10 (L) 50.0 - 100.0 ug/mL      Assessment & Plan:   Problem List Items Addressed  This Visit       Other   Opioid type dependence, continuous (HCC)   Severe recurrent major depression without psychotic features (HCC) - Primary   Polysubstance abuse (HCC)   MDD (major depressive disorder), severe (HCC)     Follow up plan: No follow-ups on file.

## 2021-09-22 DIAGNOSIS — Z419 Encounter for procedure for purposes other than remedying health state, unspecified: Secondary | ICD-10-CM | POA: Diagnosis not present

## 2021-10-02 DIAGNOSIS — F4312 Post-traumatic stress disorder, chronic: Secondary | ICD-10-CM | POA: Diagnosis not present

## 2021-10-02 DIAGNOSIS — F410 Panic disorder [episodic paroxysmal anxiety] without agoraphobia: Secondary | ICD-10-CM | POA: Diagnosis not present

## 2021-10-02 DIAGNOSIS — F314 Bipolar disorder, current episode depressed, severe, without psychotic features: Secondary | ICD-10-CM | POA: Diagnosis not present

## 2021-10-04 ENCOUNTER — Ambulatory Visit: Payer: Medicaid Other | Admitting: Family Medicine

## 2021-10-14 DIAGNOSIS — Z0001 Encounter for general adult medical examination with abnormal findings: Secondary | ICD-10-CM | POA: Diagnosis not present

## 2021-10-14 DIAGNOSIS — E785 Hyperlipidemia, unspecified: Secondary | ICD-10-CM | POA: Diagnosis not present

## 2021-10-14 DIAGNOSIS — F17210 Nicotine dependence, cigarettes, uncomplicated: Secondary | ICD-10-CM | POA: Diagnosis not present

## 2021-10-14 DIAGNOSIS — F319 Bipolar disorder, unspecified: Secondary | ICD-10-CM | POA: Diagnosis not present

## 2021-10-14 DIAGNOSIS — R972 Elevated prostate specific antigen [PSA]: Secondary | ICD-10-CM | POA: Diagnosis not present

## 2021-10-14 DIAGNOSIS — E118 Type 2 diabetes mellitus with unspecified complications: Secondary | ICD-10-CM | POA: Diagnosis not present

## 2021-10-23 DIAGNOSIS — Z419 Encounter for procedure for purposes other than remedying health state, unspecified: Secondary | ICD-10-CM | POA: Diagnosis not present

## 2021-11-20 DIAGNOSIS — Z419 Encounter for procedure for purposes other than remedying health state, unspecified: Secondary | ICD-10-CM | POA: Diagnosis not present

## 2021-11-27 DIAGNOSIS — F314 Bipolar disorder, current episode depressed, severe, without psychotic features: Secondary | ICD-10-CM | POA: Diagnosis not present

## 2021-11-27 DIAGNOSIS — F4312 Post-traumatic stress disorder, chronic: Secondary | ICD-10-CM | POA: Diagnosis not present

## 2021-11-27 DIAGNOSIS — F410 Panic disorder [episodic paroxysmal anxiety] without agoraphobia: Secondary | ICD-10-CM | POA: Diagnosis not present

## 2021-12-21 DIAGNOSIS — Z419 Encounter for procedure for purposes other than remedying health state, unspecified: Secondary | ICD-10-CM | POA: Diagnosis not present

## 2021-12-25 DIAGNOSIS — H1013 Acute atopic conjunctivitis, bilateral: Secondary | ICD-10-CM | POA: Diagnosis not present

## 2021-12-27 DIAGNOSIS — F410 Panic disorder [episodic paroxysmal anxiety] without agoraphobia: Secondary | ICD-10-CM | POA: Diagnosis not present

## 2021-12-27 DIAGNOSIS — F4312 Post-traumatic stress disorder, chronic: Secondary | ICD-10-CM | POA: Diagnosis not present

## 2021-12-27 DIAGNOSIS — F314 Bipolar disorder, current episode depressed, severe, without psychotic features: Secondary | ICD-10-CM | POA: Diagnosis not present

## 2022-01-06 DIAGNOSIS — E118 Type 2 diabetes mellitus with unspecified complications: Secondary | ICD-10-CM | POA: Diagnosis not present

## 2022-01-20 DIAGNOSIS — Z419 Encounter for procedure for purposes other than remedying health state, unspecified: Secondary | ICD-10-CM | POA: Diagnosis not present

## 2022-02-20 DIAGNOSIS — Z419 Encounter for procedure for purposes other than remedying health state, unspecified: Secondary | ICD-10-CM | POA: Diagnosis not present

## 2022-03-04 DIAGNOSIS — E785 Hyperlipidemia, unspecified: Secondary | ICD-10-CM | POA: Diagnosis not present

## 2022-03-04 DIAGNOSIS — R569 Unspecified convulsions: Secondary | ICD-10-CM | POA: Diagnosis not present

## 2022-03-04 DIAGNOSIS — I10 Essential (primary) hypertension: Secondary | ICD-10-CM | POA: Diagnosis not present

## 2022-03-04 DIAGNOSIS — F17210 Nicotine dependence, cigarettes, uncomplicated: Secondary | ICD-10-CM | POA: Diagnosis not present

## 2022-03-04 DIAGNOSIS — F319 Bipolar disorder, unspecified: Secondary | ICD-10-CM | POA: Diagnosis not present

## 2022-03-04 DIAGNOSIS — E118 Type 2 diabetes mellitus with unspecified complications: Secondary | ICD-10-CM | POA: Diagnosis not present

## 2022-03-06 DIAGNOSIS — F4312 Post-traumatic stress disorder, chronic: Secondary | ICD-10-CM | POA: Diagnosis not present

## 2022-03-06 DIAGNOSIS — F314 Bipolar disorder, current episode depressed, severe, without psychotic features: Secondary | ICD-10-CM | POA: Diagnosis not present

## 2022-03-06 DIAGNOSIS — F410 Panic disorder [episodic paroxysmal anxiety] without agoraphobia: Secondary | ICD-10-CM | POA: Diagnosis not present

## 2022-03-22 DIAGNOSIS — Z419 Encounter for procedure for purposes other than remedying health state, unspecified: Secondary | ICD-10-CM | POA: Diagnosis not present

## 2022-04-01 DIAGNOSIS — M7551 Bursitis of right shoulder: Secondary | ICD-10-CM | POA: Diagnosis not present

## 2022-04-01 DIAGNOSIS — R6889 Other general symptoms and signs: Secondary | ICD-10-CM | POA: Diagnosis not present

## 2022-04-01 DIAGNOSIS — U071 COVID-19: Secondary | ICD-10-CM | POA: Diagnosis not present

## 2022-04-22 DIAGNOSIS — Z419 Encounter for procedure for purposes other than remedying health state, unspecified: Secondary | ICD-10-CM | POA: Diagnosis not present

## 2022-04-30 DIAGNOSIS — F314 Bipolar disorder, current episode depressed, severe, without psychotic features: Secondary | ICD-10-CM | POA: Diagnosis not present

## 2022-04-30 DIAGNOSIS — F410 Panic disorder [episodic paroxysmal anxiety] without agoraphobia: Secondary | ICD-10-CM | POA: Diagnosis not present

## 2022-04-30 DIAGNOSIS — F4312 Post-traumatic stress disorder, chronic: Secondary | ICD-10-CM | POA: Diagnosis not present

## 2022-05-23 DIAGNOSIS — Z419 Encounter for procedure for purposes other than remedying health state, unspecified: Secondary | ICD-10-CM | POA: Diagnosis not present

## 2022-05-28 DIAGNOSIS — F314 Bipolar disorder, current episode depressed, severe, without psychotic features: Secondary | ICD-10-CM | POA: Diagnosis not present

## 2022-05-28 DIAGNOSIS — F4312 Post-traumatic stress disorder, chronic: Secondary | ICD-10-CM | POA: Diagnosis not present

## 2022-05-28 DIAGNOSIS — F410 Panic disorder [episodic paroxysmal anxiety] without agoraphobia: Secondary | ICD-10-CM | POA: Diagnosis not present

## 2022-06-22 DIAGNOSIS — Z419 Encounter for procedure for purposes other than remedying health state, unspecified: Secondary | ICD-10-CM | POA: Diagnosis not present

## 2022-06-30 DIAGNOSIS — E118 Type 2 diabetes mellitus with unspecified complications: Secondary | ICD-10-CM | POA: Diagnosis not present

## 2022-07-04 DIAGNOSIS — I10 Essential (primary) hypertension: Secondary | ICD-10-CM | POA: Diagnosis not present

## 2022-07-04 DIAGNOSIS — Z125 Encounter for screening for malignant neoplasm of prostate: Secondary | ICD-10-CM | POA: Diagnosis not present

## 2022-07-04 DIAGNOSIS — E785 Hyperlipidemia, unspecified: Secondary | ICD-10-CM | POA: Diagnosis not present

## 2022-07-04 DIAGNOSIS — R0602 Shortness of breath: Secondary | ICD-10-CM | POA: Diagnosis not present

## 2022-07-04 DIAGNOSIS — K219 Gastro-esophageal reflux disease without esophagitis: Secondary | ICD-10-CM | POA: Diagnosis not present

## 2022-07-04 DIAGNOSIS — R569 Unspecified convulsions: Secondary | ICD-10-CM | POA: Diagnosis not present

## 2022-07-04 DIAGNOSIS — F319 Bipolar disorder, unspecified: Secondary | ICD-10-CM | POA: Diagnosis not present

## 2022-07-04 DIAGNOSIS — E118 Type 2 diabetes mellitus with unspecified complications: Secondary | ICD-10-CM | POA: Diagnosis not present

## 2022-07-04 DIAGNOSIS — F17210 Nicotine dependence, cigarettes, uncomplicated: Secondary | ICD-10-CM | POA: Diagnosis not present

## 2022-07-23 DIAGNOSIS — F314 Bipolar disorder, current episode depressed, severe, without psychotic features: Secondary | ICD-10-CM | POA: Diagnosis not present

## 2022-07-23 DIAGNOSIS — F4312 Post-traumatic stress disorder, chronic: Secondary | ICD-10-CM | POA: Diagnosis not present

## 2022-07-23 DIAGNOSIS — Z79899 Other long term (current) drug therapy: Secondary | ICD-10-CM | POA: Diagnosis not present

## 2022-07-23 DIAGNOSIS — Z419 Encounter for procedure for purposes other than remedying health state, unspecified: Secondary | ICD-10-CM | POA: Diagnosis not present

## 2022-07-23 DIAGNOSIS — Z5181 Encounter for therapeutic drug level monitoring: Secondary | ICD-10-CM | POA: Diagnosis not present

## 2022-07-23 DIAGNOSIS — F410 Panic disorder [episodic paroxysmal anxiety] without agoraphobia: Secondary | ICD-10-CM | POA: Diagnosis not present

## 2022-08-18 DIAGNOSIS — G479 Sleep disorder, unspecified: Secondary | ICD-10-CM | POA: Diagnosis not present

## 2022-08-18 DIAGNOSIS — R059 Cough, unspecified: Secondary | ICD-10-CM | POA: Diagnosis not present

## 2022-08-18 DIAGNOSIS — R0683 Snoring: Secondary | ICD-10-CM | POA: Diagnosis not present

## 2022-08-18 DIAGNOSIS — R0602 Shortness of breath: Secondary | ICD-10-CM | POA: Diagnosis not present

## 2022-08-18 DIAGNOSIS — J849 Interstitial pulmonary disease, unspecified: Secondary | ICD-10-CM | POA: Diagnosis not present

## 2022-08-18 DIAGNOSIS — J439 Emphysema, unspecified: Secondary | ICD-10-CM | POA: Diagnosis not present

## 2022-08-18 DIAGNOSIS — J90 Pleural effusion, not elsewhere classified: Secondary | ICD-10-CM | POA: Diagnosis not present

## 2022-08-20 DIAGNOSIS — F4312 Post-traumatic stress disorder, chronic: Secondary | ICD-10-CM | POA: Diagnosis not present

## 2022-08-20 DIAGNOSIS — F410 Panic disorder [episodic paroxysmal anxiety] without agoraphobia: Secondary | ICD-10-CM | POA: Diagnosis not present

## 2022-08-20 DIAGNOSIS — F314 Bipolar disorder, current episode depressed, severe, without psychotic features: Secondary | ICD-10-CM | POA: Diagnosis not present

## 2022-08-22 DIAGNOSIS — Z419 Encounter for procedure for purposes other than remedying health state, unspecified: Secondary | ICD-10-CM | POA: Diagnosis not present

## 2022-08-25 DIAGNOSIS — I10 Essential (primary) hypertension: Secondary | ICD-10-CM | POA: Diagnosis not present

## 2022-08-25 DIAGNOSIS — E118 Type 2 diabetes mellitus with unspecified complications: Secondary | ICD-10-CM | POA: Diagnosis not present

## 2022-08-25 DIAGNOSIS — E785 Hyperlipidemia, unspecified: Secondary | ICD-10-CM | POA: Diagnosis not present

## 2022-08-25 DIAGNOSIS — I6523 Occlusion and stenosis of bilateral carotid arteries: Secondary | ICD-10-CM | POA: Diagnosis not present

## 2022-09-08 DIAGNOSIS — I6523 Occlusion and stenosis of bilateral carotid arteries: Secondary | ICD-10-CM | POA: Diagnosis not present

## 2022-09-10 ENCOUNTER — Other Ambulatory Visit: Payer: Self-pay | Admitting: Specialist

## 2022-09-10 DIAGNOSIS — J849 Interstitial pulmonary disease, unspecified: Secondary | ICD-10-CM

## 2022-09-10 DIAGNOSIS — R0602 Shortness of breath: Secondary | ICD-10-CM

## 2022-09-17 DIAGNOSIS — F314 Bipolar disorder, current episode depressed, severe, without psychotic features: Secondary | ICD-10-CM | POA: Diagnosis not present

## 2022-09-17 DIAGNOSIS — F410 Panic disorder [episodic paroxysmal anxiety] without agoraphobia: Secondary | ICD-10-CM | POA: Diagnosis not present

## 2022-09-17 DIAGNOSIS — F4312 Post-traumatic stress disorder, chronic: Secondary | ICD-10-CM | POA: Diagnosis not present

## 2022-09-22 DIAGNOSIS — Z419 Encounter for procedure for purposes other than remedying health state, unspecified: Secondary | ICD-10-CM | POA: Diagnosis not present

## 2022-09-26 ENCOUNTER — Ambulatory Visit: Payer: Medicaid Other

## 2022-10-23 DIAGNOSIS — Z419 Encounter for procedure for purposes other than remedying health state, unspecified: Secondary | ICD-10-CM | POA: Diagnosis not present

## 2022-11-11 DIAGNOSIS — Z0001 Encounter for general adult medical examination with abnormal findings: Secondary | ICD-10-CM | POA: Diagnosis not present

## 2022-11-11 DIAGNOSIS — F319 Bipolar disorder, unspecified: Secondary | ICD-10-CM | POA: Diagnosis not present

## 2022-11-11 DIAGNOSIS — Z125 Encounter for screening for malignant neoplasm of prostate: Secondary | ICD-10-CM | POA: Diagnosis not present

## 2022-11-11 DIAGNOSIS — I10 Essential (primary) hypertension: Secondary | ICD-10-CM | POA: Diagnosis not present

## 2022-11-11 DIAGNOSIS — E785 Hyperlipidemia, unspecified: Secondary | ICD-10-CM | POA: Diagnosis not present

## 2022-11-11 DIAGNOSIS — J439 Emphysema, unspecified: Secondary | ICD-10-CM | POA: Diagnosis not present

## 2022-11-11 DIAGNOSIS — F17210 Nicotine dependence, cigarettes, uncomplicated: Secondary | ICD-10-CM | POA: Diagnosis not present

## 2022-11-11 DIAGNOSIS — R972 Elevated prostate specific antigen [PSA]: Secondary | ICD-10-CM | POA: Diagnosis not present

## 2022-11-11 DIAGNOSIS — R569 Unspecified convulsions: Secondary | ICD-10-CM | POA: Diagnosis not present

## 2022-11-11 DIAGNOSIS — E118 Type 2 diabetes mellitus with unspecified complications: Secondary | ICD-10-CM | POA: Diagnosis not present

## 2022-11-14 DIAGNOSIS — I6523 Occlusion and stenosis of bilateral carotid arteries: Secondary | ICD-10-CM | POA: Diagnosis not present

## 2022-11-21 DIAGNOSIS — Z419 Encounter for procedure for purposes other than remedying health state, unspecified: Secondary | ICD-10-CM | POA: Diagnosis not present

## 2022-12-15 DIAGNOSIS — F314 Bipolar disorder, current episode depressed, severe, without psychotic features: Secondary | ICD-10-CM | POA: Diagnosis not present

## 2022-12-15 DIAGNOSIS — F410 Panic disorder [episodic paroxysmal anxiety] without agoraphobia: Secondary | ICD-10-CM | POA: Diagnosis not present

## 2022-12-15 DIAGNOSIS — F4312 Post-traumatic stress disorder, chronic: Secondary | ICD-10-CM | POA: Diagnosis not present

## 2022-12-22 DIAGNOSIS — Z419 Encounter for procedure for purposes other than remedying health state, unspecified: Secondary | ICD-10-CM | POA: Diagnosis not present

## 2023-01-05 ENCOUNTER — Ambulatory Visit: Payer: 59 | Admitting: Internal Medicine

## 2023-01-05 NOTE — Progress Notes (Unsigned)
Pt did not show for scheduled appt

## 2023-01-21 DIAGNOSIS — Z419 Encounter for procedure for purposes other than remedying health state, unspecified: Secondary | ICD-10-CM | POA: Diagnosis not present

## 2023-02-21 DIAGNOSIS — Z419 Encounter for procedure for purposes other than remedying health state, unspecified: Secondary | ICD-10-CM | POA: Diagnosis not present

## 2023-02-24 DIAGNOSIS — E118 Type 2 diabetes mellitus with unspecified complications: Secondary | ICD-10-CM | POA: Diagnosis not present

## 2023-02-24 DIAGNOSIS — J439 Emphysema, unspecified: Secondary | ICD-10-CM | POA: Diagnosis not present

## 2023-02-24 DIAGNOSIS — Z720 Tobacco use: Secondary | ICD-10-CM | POA: Diagnosis not present

## 2023-02-24 DIAGNOSIS — E785 Hyperlipidemia, unspecified: Secondary | ICD-10-CM | POA: Diagnosis not present

## 2023-02-24 DIAGNOSIS — I10 Essential (primary) hypertension: Secondary | ICD-10-CM | POA: Diagnosis not present

## 2023-02-24 DIAGNOSIS — I6523 Occlusion and stenosis of bilateral carotid arteries: Secondary | ICD-10-CM | POA: Diagnosis not present

## 2023-03-09 DIAGNOSIS — R059 Cough, unspecified: Secondary | ICD-10-CM | POA: Diagnosis not present

## 2023-03-09 DIAGNOSIS — J90 Pleural effusion, not elsewhere classified: Secondary | ICD-10-CM | POA: Diagnosis not present

## 2023-03-09 DIAGNOSIS — R053 Chronic cough: Secondary | ICD-10-CM | POA: Diagnosis not present

## 2023-03-23 DIAGNOSIS — Z419 Encounter for procedure for purposes other than remedying health state, unspecified: Secondary | ICD-10-CM | POA: Diagnosis not present

## 2023-04-02 DIAGNOSIS — E118 Type 2 diabetes mellitus with unspecified complications: Secondary | ICD-10-CM | POA: Diagnosis not present

## 2023-04-02 DIAGNOSIS — E785 Hyperlipidemia, unspecified: Secondary | ICD-10-CM | POA: Diagnosis not present

## 2023-04-02 DIAGNOSIS — J439 Emphysema, unspecified: Secondary | ICD-10-CM | POA: Diagnosis not present

## 2023-04-02 DIAGNOSIS — F17210 Nicotine dependence, cigarettes, uncomplicated: Secondary | ICD-10-CM | POA: Diagnosis not present

## 2023-04-02 DIAGNOSIS — R569 Unspecified convulsions: Secondary | ICD-10-CM | POA: Diagnosis not present

## 2023-04-02 DIAGNOSIS — K219 Gastro-esophageal reflux disease without esophagitis: Secondary | ICD-10-CM | POA: Diagnosis not present

## 2023-04-02 DIAGNOSIS — I10 Essential (primary) hypertension: Secondary | ICD-10-CM | POA: Diagnosis not present

## 2023-04-02 DIAGNOSIS — F319 Bipolar disorder, unspecified: Secondary | ICD-10-CM | POA: Diagnosis not present

## 2023-04-14 DIAGNOSIS — F410 Panic disorder [episodic paroxysmal anxiety] without agoraphobia: Secondary | ICD-10-CM | POA: Diagnosis not present

## 2023-04-14 DIAGNOSIS — F314 Bipolar disorder, current episode depressed, severe, without psychotic features: Secondary | ICD-10-CM | POA: Diagnosis not present

## 2023-04-14 DIAGNOSIS — F4312 Post-traumatic stress disorder, chronic: Secondary | ICD-10-CM | POA: Diagnosis not present

## 2023-04-23 DIAGNOSIS — Z419 Encounter for procedure for purposes other than remedying health state, unspecified: Secondary | ICD-10-CM | POA: Diagnosis not present

## 2023-05-05 DIAGNOSIS — J439 Emphysema, unspecified: Secondary | ICD-10-CM | POA: Diagnosis not present

## 2023-05-05 DIAGNOSIS — R0609 Other forms of dyspnea: Secondary | ICD-10-CM | POA: Diagnosis not present

## 2023-05-24 DIAGNOSIS — Z419 Encounter for procedure for purposes other than remedying health state, unspecified: Secondary | ICD-10-CM | POA: Diagnosis not present

## 2023-05-31 ENCOUNTER — Other Ambulatory Visit: Payer: Self-pay

## 2023-05-31 ENCOUNTER — Emergency Department
Admission: EM | Admit: 2023-05-31 | Discharge: 2023-05-31 | Disposition: A | Discharge status: Home / Self Care | Payer: 59 | Attending: Emergency Medicine | Admitting: Emergency Medicine

## 2023-05-31 ENCOUNTER — Encounter: Payer: Self-pay | Admitting: Emergency Medicine

## 2023-05-31 DIAGNOSIS — K029 Dental caries, unspecified: Secondary | ICD-10-CM

## 2023-05-31 DIAGNOSIS — K0889 Other specified disorders of teeth and supporting structures: Secondary | ICD-10-CM | POA: Diagnosis not present

## 2023-05-31 DIAGNOSIS — K047 Periapical abscess without sinus: Secondary | ICD-10-CM | POA: Diagnosis not present

## 2023-05-31 MED ORDER — CHLORHEXIDINE GLUCONATE 0.12 % MT SOLN
15.0000 mL | Freq: Two times a day (BID) | OROMUCOSAL | 0 refills | Status: AC
Start: 2023-05-31 — End: ?

## 2023-05-31 MED ORDER — KETOROLAC TROMETHAMINE 15 MG/ML IJ SOLN
15.0000 mg | Freq: Once | INTRAMUSCULAR | Status: AC
  Administered 2023-05-31: 15 mg via INTRAMUSCULAR
  Filled 2023-05-31: qty 1

## 2023-05-31 MED ORDER — AMOXICILLIN-POT CLAVULANATE 875-125 MG PO TABS: 1.0000 | ORAL_TABLET | Freq: Two times a day (BID) | ORAL | 0 refills | Status: AC

## 2023-05-31 NOTE — Discharge Instructions (Signed)
Please follow-up with your dentist.  Please return for any new, worsening, or changing symptoms or other concerns.  It was a pleasure caring for you today.

## 2023-05-31 NOTE — ED Provider Notes (Signed)
[facility] Provider Note    Event Date/Time   First MD Initiated Contact with Patient 05/31/23 1042     (approximate)   History   Dental Pain   HPI  Jacob Holmes is a 47 y.o. male with a past medical history of major depression, opiate dependence, polysubstance use who presents today for evaluation of dental pain.  Patient reports that his symptoms began approximately 2 days ago.  He reports that he knows that he needs to see a dentist but has not yet, plans to call tomorrow.  No fevers or chills.  No facial or neck swelling.  No trouble swallowing.  No voice change.  Patient Active Problem List   Diagnosis Date Noted   MDD (major depressive disorder), severe (HCC) 08/11/2019   History of seizure disorder 08/11/2019   Major depression 05/11/2015   Opioid type dependence, continuous (HCC) 05/09/2015   Severe recurrent major depression without psychotic features (HCC) 05/09/2015   Polysubstance abuse (HCC) 05/09/2015          Physical Exam   Triage Vital Signs: ED Triage Vitals  Encounter Vitals Group     BP 05/31/23 1024 (!) 146/96     Systolic BP Percentile --      Diastolic BP Percentile --      Pulse Rate 05/31/23 1024 88     Resp 05/31/23 1024 18     Temp 05/31/23 1024 98.1 F (36.7 C)     Temp Source 05/31/23 1024 Oral     SpO2 05/31/23 1024 98 %     Weight 05/31/23 1023 204 lb (92.5 kg)     Height 05/31/23 1023 5\' 6"  (1.676 m)     Head Circumference --      Peak Flow --      Pain Score 05/31/23 1023 8     Pain Loc --      Pain Education --      Exclude from Growth Chart --     Most recent vital signs: Vitals:   05/31/23 1024  BP: (!) 146/96  Pulse: 88  Resp: 18  Temp: 98.1 F (36.7 C)  SpO2: 98%    Physical Exam Vitals and nursing note reviewed.  Constitutional:      General: Awake and alert. No acute distress.    Appearance: Normal appearance. The patient is normal weight.  HENT:     Head: Normocephalic  and atraumatic.     Mouth: Mucous membranes are moist.  Poor dentition diffusely with multiple missing and decayed teeth.  Tooth #22 is broken with obvious large area of decay.  No gingival fluctuance or tenderness.  No sublingual swelling.  No trismus.  No facial or neck swelling or erythema. Eyes:     General: PERRL. Normal EOMs        Right eye: No discharge.        Left eye: No discharge.     Conjunctiva/sclera: Conjunctivae normal.  Cardiovascular:     Rate and Rhythm: Normal rate and regular rhythm.     Pulses: Normal pulses.  Pulmonary:     Effort: Pulmonary effort is normal. No respiratory distress.     Breath sounds: Normal breath sounds.  Abdominal:     Abdomen is soft. There is no abdominal tenderness. No rebound or guarding. No distention. Musculoskeletal:        General: No swelling. Normal range of motion.     Cervical back: Normal range of motion and neck supple.  Skin:    General: Skin is warm and dry.     Capillary Refill: Capillary refill takes less than 2 seconds.     Findings: No rash.  Neurological:     Mental Status: The patient is awake and alert.      ED Results / Procedures / Treatments   Labs (all labs ordered are listed, but only abnormal results are displayed) Labs Reviewed - No data to display   EKG     RADIOLOGY     PROCEDURES:  Critical Care performed:   Procedures   MEDICATIONS ORDERED IN ED: Medications  ketorolac (TORADOL) 15 MG/ML injection 15 mg (15 mg Intramuscular Given 05/31/23 1112)     IMPRESSION / MDM / ASSESSMENT AND PLAN / ED COURSE  I reviewed the triage vital signs and the nursing notes.   Differential diagnosis includes, but is not limited to, dental decay, dental caries, pulpitis, abscess.  Patient was evaluated in the emergency department for dental pain. Patient has tenderness diffuse dental decay and missing teeth, I suspect some dental caries vs pulpitits. No gingival swelling or fluctuance concerning for  gingival abscess.  No trismus, nuchal rigidity, neck pain, hot potato voice, uvular deviation or malocclusion to suggest deep space infection. No sublingual swelling concerning for Ludwig's angina.  Patient was started on antibiotics and chlorhexidine mouth rinse.  Patient was treated symptomatically in the emergency department. Discussed care plan, return precautions, and advised close outpatient follow-up with dentist. Patient agrees with plan of care.    Patient's presentation is most consistent with acute complicated illness / injury requiring diagnostic workup.      FINAL CLINICAL IMPRESSION(S) / ED DIAGNOSES   Final diagnoses:  Pain due to dental caries  Infected dental caries     Rx / DC Orders   ED Discharge Orders          Ordered    amoxicillin-clavulanate (AUGMENTIN) 875-125 MG tablet  2 times daily        05/31/23 1100    chlorhexidine (PERIDEX) 0.12 % solution  2 times daily        05/31/23 1100             Note:  This document was prepared using Dragon voice recognition software and may include unintentional dictation errors.   Jackelyn Hoehn, PA-C 05/31/23 1142    Janith Lima, MD 05/31/23 [phone]

## 2023-05-31 NOTE — ED Triage Notes (Signed)
Pt via POV from home. Pt c/o L lower dental pain for the past 2 days. States that the pain usually goes away but it hasn't stopped. Reports that he will schedule with a dentist this week. Denies fever. Pt is A&Ox4 and NAD

## 2023-05-31 NOTE — ED Notes (Signed)
See triage note Presents with dental pain  States pain started 2 days ago  Afebrile on arrival

## 2023-06-01 NOTE — Group Note (Deleted)

## 2023-06-23 DIAGNOSIS — Z419 Encounter for procedure for purposes other than remedying health state, unspecified: Secondary | ICD-10-CM | POA: Diagnosis not present

## 2023-07-10 DIAGNOSIS — F410 Panic disorder [episodic paroxysmal anxiety] without agoraphobia: Secondary | ICD-10-CM | POA: Diagnosis not present

## 2023-07-10 DIAGNOSIS — F314 Bipolar disorder, current episode depressed, severe, without psychotic features: Secondary | ICD-10-CM | POA: Diagnosis not present

## 2023-07-10 DIAGNOSIS — F4312 Post-traumatic stress disorder, chronic: Secondary | ICD-10-CM | POA: Diagnosis not present

## 2023-07-13 DIAGNOSIS — K047 Periapical abscess without sinus: Secondary | ICD-10-CM | POA: Diagnosis not present

## 2023-07-13 DIAGNOSIS — J439 Emphysema, unspecified: Secondary | ICD-10-CM | POA: Diagnosis not present

## 2023-07-24 DIAGNOSIS — Z419 Encounter for procedure for purposes other than remedying health state, unspecified: Secondary | ICD-10-CM | POA: Diagnosis not present

## 2023-08-23 DIAGNOSIS — Z419 Encounter for procedure for purposes other than remedying health state, unspecified: Secondary | ICD-10-CM | POA: Diagnosis not present

## 2023-08-26 DIAGNOSIS — E119 Type 2 diabetes mellitus without complications: Secondary | ICD-10-CM | POA: Diagnosis not present

## 2023-08-27 DIAGNOSIS — H5213 Myopia, bilateral: Secondary | ICD-10-CM | POA: Diagnosis not present

## 2023-09-23 DIAGNOSIS — Z419 Encounter for procedure for purposes other than remedying health state, unspecified: Secondary | ICD-10-CM | POA: Diagnosis not present

## 2023-09-25 DIAGNOSIS — J439 Emphysema, unspecified: Secondary | ICD-10-CM | POA: Diagnosis not present

## 2023-09-25 DIAGNOSIS — E118 Type 2 diabetes mellitus with unspecified complications: Secondary | ICD-10-CM | POA: Diagnosis not present

## 2023-09-25 DIAGNOSIS — Z720 Tobacco use: Secondary | ICD-10-CM | POA: Diagnosis not present

## 2023-09-25 DIAGNOSIS — I10 Essential (primary) hypertension: Secondary | ICD-10-CM | POA: Diagnosis not present

## 2023-09-25 DIAGNOSIS — I6523 Occlusion and stenosis of bilateral carotid arteries: Secondary | ICD-10-CM | POA: Diagnosis not present

## 2023-09-25 DIAGNOSIS — E785 Hyperlipidemia, unspecified: Secondary | ICD-10-CM | POA: Diagnosis not present

## 2023-10-05 DIAGNOSIS — J439 Emphysema, unspecified: Secondary | ICD-10-CM | POA: Diagnosis not present

## 2023-10-05 DIAGNOSIS — R052 Subacute cough: Secondary | ICD-10-CM | POA: Diagnosis not present

## 2023-10-05 DIAGNOSIS — F17218 Nicotine dependence, cigarettes, with other nicotine-induced disorders: Secondary | ICD-10-CM | POA: Diagnosis not present

## 2023-10-05 DIAGNOSIS — R942 Abnormal results of pulmonary function studies: Secondary | ICD-10-CM | POA: Diagnosis not present

## 2023-10-07 DIAGNOSIS — F314 Bipolar disorder, current episode depressed, severe, without psychotic features: Secondary | ICD-10-CM | POA: Diagnosis not present

## 2023-10-07 DIAGNOSIS — F4312 Post-traumatic stress disorder, chronic: Secondary | ICD-10-CM | POA: Diagnosis not present

## 2023-10-07 DIAGNOSIS — F410 Panic disorder [episodic paroxysmal anxiety] without agoraphobia: Secondary | ICD-10-CM | POA: Diagnosis not present

## 2023-10-24 DIAGNOSIS — Z419 Encounter for procedure for purposes other than remedying health state, unspecified: Secondary | ICD-10-CM | POA: Diagnosis not present

## 2023-11-21 DIAGNOSIS — Z419 Encounter for procedure for purposes other than remedying health state, unspecified: Secondary | ICD-10-CM | POA: Diagnosis not present

## 2024-01-02 DIAGNOSIS — Z419 Encounter for procedure for purposes other than remedying health state, unspecified: Secondary | ICD-10-CM | POA: Diagnosis not present

## 2024-01-04 DIAGNOSIS — F4312 Post-traumatic stress disorder, chronic: Secondary | ICD-10-CM | POA: Diagnosis not present

## 2024-01-04 DIAGNOSIS — F410 Panic disorder [episodic paroxysmal anxiety] without agoraphobia: Secondary | ICD-10-CM | POA: Diagnosis not present

## 2024-01-04 DIAGNOSIS — F314 Bipolar disorder, current episode depressed, severe, without psychotic features: Secondary | ICD-10-CM | POA: Diagnosis not present

## 2024-02-01 DIAGNOSIS — Z419 Encounter for procedure for purposes other than remedying health state, unspecified: Secondary | ICD-10-CM | POA: Diagnosis not present

## 2024-02-02 DIAGNOSIS — E118 Type 2 diabetes mellitus with unspecified complications: Secondary | ICD-10-CM | POA: Diagnosis not present

## 2024-02-02 DIAGNOSIS — J439 Emphysema, unspecified: Secondary | ICD-10-CM | POA: Diagnosis not present

## 2024-02-02 DIAGNOSIS — F319 Bipolar disorder, unspecified: Secondary | ICD-10-CM | POA: Diagnosis not present

## 2024-02-02 DIAGNOSIS — K219 Gastro-esophageal reflux disease without esophagitis: Secondary | ICD-10-CM | POA: Diagnosis not present

## 2024-02-02 DIAGNOSIS — I10 Essential (primary) hypertension: Secondary | ICD-10-CM | POA: Diagnosis not present

## 2024-02-02 DIAGNOSIS — F17210 Nicotine dependence, cigarettes, uncomplicated: Secondary | ICD-10-CM | POA: Diagnosis not present

## 2024-02-02 DIAGNOSIS — E785 Hyperlipidemia, unspecified: Secondary | ICD-10-CM | POA: Diagnosis not present

## 2024-02-02 DIAGNOSIS — R569 Unspecified convulsions: Secondary | ICD-10-CM | POA: Diagnosis not present

## 2024-03-03 DIAGNOSIS — Z419 Encounter for procedure for purposes other than remedying health state, unspecified: Secondary | ICD-10-CM | POA: Diagnosis not present

## 2024-03-04 DIAGNOSIS — F4312 Post-traumatic stress disorder, chronic: Secondary | ICD-10-CM | POA: Diagnosis not present

## 2024-03-04 DIAGNOSIS — F410 Panic disorder [episodic paroxysmal anxiety] without agoraphobia: Secondary | ICD-10-CM | POA: Diagnosis not present

## 2024-03-04 DIAGNOSIS — F314 Bipolar disorder, current episode depressed, severe, without psychotic features: Secondary | ICD-10-CM | POA: Diagnosis not present

## 2024-03-24 DIAGNOSIS — E118 Type 2 diabetes mellitus with unspecified complications: Secondary | ICD-10-CM | POA: Diagnosis not present

## 2024-03-24 DIAGNOSIS — Z720 Tobacco use: Secondary | ICD-10-CM | POA: Diagnosis not present

## 2024-03-24 DIAGNOSIS — E785 Hyperlipidemia, unspecified: Secondary | ICD-10-CM | POA: Diagnosis not present

## 2024-03-24 DIAGNOSIS — R002 Palpitations: Secondary | ICD-10-CM | POA: Diagnosis not present

## 2024-03-24 DIAGNOSIS — I6523 Occlusion and stenosis of bilateral carotid arteries: Secondary | ICD-10-CM | POA: Diagnosis not present

## 2024-03-24 DIAGNOSIS — F319 Bipolar disorder, unspecified: Secondary | ICD-10-CM | POA: Diagnosis not present

## 2024-03-24 DIAGNOSIS — I10 Essential (primary) hypertension: Secondary | ICD-10-CM | POA: Diagnosis not present

## 2024-03-24 DIAGNOSIS — J439 Emphysema, unspecified: Secondary | ICD-10-CM | POA: Diagnosis not present

## 2024-04-02 DIAGNOSIS — Z419 Encounter for procedure for purposes other than remedying health state, unspecified: Secondary | ICD-10-CM | POA: Diagnosis not present

## 2024-04-12 DIAGNOSIS — R0602 Shortness of breath: Secondary | ICD-10-CM | POA: Diagnosis not present

## 2024-04-12 DIAGNOSIS — R042 Hemoptysis: Secondary | ICD-10-CM | POA: Diagnosis not present

## 2024-04-12 DIAGNOSIS — J302 Other seasonal allergic rhinitis: Secondary | ICD-10-CM | POA: Diagnosis not present

## 2024-04-12 DIAGNOSIS — J439 Emphysema, unspecified: Secondary | ICD-10-CM | POA: Diagnosis not present

## 2024-04-12 DIAGNOSIS — J441 Chronic obstructive pulmonary disease with (acute) exacerbation: Secondary | ICD-10-CM | POA: Diagnosis not present

## 2024-04-27 DIAGNOSIS — R002 Palpitations: Secondary | ICD-10-CM | POA: Diagnosis not present

## 2024-05-03 DIAGNOSIS — Z419 Encounter for procedure for purposes other than remedying health state, unspecified: Secondary | ICD-10-CM | POA: Diagnosis not present

## 2024-05-05 DIAGNOSIS — I493 Ventricular premature depolarization: Secondary | ICD-10-CM | POA: Diagnosis not present

## 2024-05-05 DIAGNOSIS — E785 Hyperlipidemia, unspecified: Secondary | ICD-10-CM | POA: Diagnosis not present

## 2024-05-05 DIAGNOSIS — R002 Palpitations: Secondary | ICD-10-CM | POA: Diagnosis not present

## 2024-05-05 DIAGNOSIS — I10 Essential (primary) hypertension: Secondary | ICD-10-CM | POA: Diagnosis not present

## 2024-05-05 DIAGNOSIS — E118 Type 2 diabetes mellitus with unspecified complications: Secondary | ICD-10-CM | POA: Diagnosis not present

## 2024-05-06 DIAGNOSIS — F314 Bipolar disorder, current episode depressed, severe, without psychotic features: Secondary | ICD-10-CM | POA: Diagnosis not present

## 2024-05-06 DIAGNOSIS — F4312 Post-traumatic stress disorder, chronic: Secondary | ICD-10-CM | POA: Diagnosis not present

## 2024-05-06 DIAGNOSIS — F410 Panic disorder [episodic paroxysmal anxiety] without agoraphobia: Secondary | ICD-10-CM | POA: Diagnosis not present

## 2024-06-03 DIAGNOSIS — Z419 Encounter for procedure for purposes other than remedying health state, unspecified: Secondary | ICD-10-CM | POA: Diagnosis not present

## 2024-06-27 ENCOUNTER — Encounter: Payer: Self-pay | Admitting: *Deleted

## 2024-06-27 NOTE — Progress Notes (Signed)
 Jacob Holmes                                          MRN: [id number]   06/27/2024   The [facility] Quality Team Specialist reviewed this patient medical record for the purposes of chart review for care gap closure. The following were reviewed: abstraction for care gap closure-glycemic status assessment.    [facility] Quality Team

## 2024-09-02 DIAGNOSIS — Z419 Encounter for procedure for purposes other than remedying health state, unspecified: Secondary | ICD-10-CM | POA: Diagnosis not present
